# Patient Record
Sex: Female | Born: 1965
Health system: Southern US, Community
[De-identification: ages and names within clinical notes are randomized; demographics above are authoritative.]

## PROBLEM LIST (undated history)

## (undated) DIAGNOSIS — E559 Vitamin D deficiency, unspecified: Secondary | ICD-10-CM

## (undated) DIAGNOSIS — B029 Zoster without complications: Secondary | ICD-10-CM

## (undated) DIAGNOSIS — E119 Type 2 diabetes mellitus without complications: Secondary | ICD-10-CM

## (undated) HISTORY — DX: Vitamin D deficiency, unspecified: E55.9

## (undated) HISTORY — DX: Zoster without complications: B02.9

## (undated) HISTORY — DX: Type 2 diabetes mellitus without complications: E11.9

---

## 1998-01-07 ENCOUNTER — Other Ambulatory Visit: Admission: RE | Admit: 1998-01-07 | Discharge: 1998-01-07 | Payer: Self-pay | Admitting: Obstetrics and Gynecology

## 2001-03-08 ENCOUNTER — Other Ambulatory Visit: Admission: RE | Admit: 2001-03-08 | Discharge: 2001-03-08 | Payer: Self-pay | Admitting: Obstetrics and Gynecology

## 2002-12-10 ENCOUNTER — Encounter: Admission: RE | Admit: 2002-12-10 | Discharge: 2003-03-10 | Payer: Self-pay | Admitting: Family Medicine

## 2003-07-03 ENCOUNTER — Encounter: Admission: RE | Admit: 2003-07-03 | Discharge: 2003-10-01 | Payer: Self-pay | Admitting: Family Medicine

## 2008-07-23 ENCOUNTER — Encounter: Admission: RE | Admit: 2008-07-23 | Discharge: 2008-07-23 | Payer: Self-pay | Admitting: Obstetrics and Gynecology

## 2009-09-28 ENCOUNTER — Encounter: Admission: RE | Admit: 2009-09-28 | Discharge: 2009-09-28 | Payer: Self-pay | Admitting: Obstetrics and Gynecology

## 2009-10-29 ENCOUNTER — Encounter: Admission: RE | Admit: 2009-10-29 | Discharge: 2009-10-29 | Payer: Self-pay | Admitting: Obstetrics and Gynecology

## 2010-07-18 ENCOUNTER — Encounter: Payer: Self-pay | Admitting: Obstetrics and Gynecology

## 2010-11-02 ENCOUNTER — Other Ambulatory Visit: Payer: Self-pay | Admitting: Obstetrics and Gynecology

## 2013-07-11 ENCOUNTER — Other Ambulatory Visit: Payer: Self-pay | Admitting: Obstetrics and Gynecology

## 2016-07-11 DIAGNOSIS — Z3042 Encounter for surveillance of injectable contraceptive: Secondary | ICD-10-CM | POA: Diagnosis not present

## 2016-09-02 DIAGNOSIS — E785 Hyperlipidemia, unspecified: Secondary | ICD-10-CM | POA: Diagnosis not present

## 2016-09-02 DIAGNOSIS — E1165 Type 2 diabetes mellitus with hyperglycemia: Secondary | ICD-10-CM | POA: Diagnosis not present

## 2016-09-02 DIAGNOSIS — A6000 Herpesviral infection of urogenital system, unspecified: Secondary | ICD-10-CM | POA: Diagnosis not present

## 2016-09-07 DIAGNOSIS — Z01419 Encounter for gynecological examination (general) (routine) without abnormal findings: Secondary | ICD-10-CM | POA: Diagnosis not present

## 2016-09-07 DIAGNOSIS — Z1231 Encounter for screening mammogram for malignant neoplasm of breast: Secondary | ICD-10-CM | POA: Diagnosis not present

## 2016-09-07 DIAGNOSIS — Z681 Body mass index (BMI) 19 or less, adult: Secondary | ICD-10-CM | POA: Diagnosis not present

## 2016-10-03 DIAGNOSIS — Z3042 Encounter for surveillance of injectable contraceptive: Secondary | ICD-10-CM | POA: Diagnosis not present

## 2016-10-03 DIAGNOSIS — Z681 Body mass index (BMI) 19 or less, adult: Secondary | ICD-10-CM | POA: Diagnosis not present

## 2016-11-02 DIAGNOSIS — H25013 Cortical age-related cataract, bilateral: Secondary | ICD-10-CM | POA: Diagnosis not present

## 2016-11-02 DIAGNOSIS — E119 Type 2 diabetes mellitus without complications: Secondary | ICD-10-CM | POA: Diagnosis not present

## 2016-11-02 DIAGNOSIS — H26051 Posterior subcapsular polar infantile and juvenile cataract, right eye: Secondary | ICD-10-CM | POA: Diagnosis not present

## 2016-12-07 DIAGNOSIS — E1165 Type 2 diabetes mellitus with hyperglycemia: Secondary | ICD-10-CM | POA: Diagnosis not present

## 2017-01-02 DIAGNOSIS — Z3042 Encounter for surveillance of injectable contraceptive: Secondary | ICD-10-CM | POA: Diagnosis not present

## 2017-01-02 DIAGNOSIS — Z681 Body mass index (BMI) 19 or less, adult: Secondary | ICD-10-CM | POA: Diagnosis not present

## 2017-01-10 DIAGNOSIS — H2511 Age-related nuclear cataract, right eye: Secondary | ICD-10-CM | POA: Diagnosis not present

## 2017-01-10 DIAGNOSIS — H25043 Posterior subcapsular polar age-related cataract, bilateral: Secondary | ICD-10-CM | POA: Diagnosis not present

## 2017-01-10 DIAGNOSIS — H2513 Age-related nuclear cataract, bilateral: Secondary | ICD-10-CM | POA: Diagnosis not present

## 2017-01-10 DIAGNOSIS — H25013 Cortical age-related cataract, bilateral: Secondary | ICD-10-CM | POA: Diagnosis not present

## 2017-01-10 DIAGNOSIS — E119 Type 2 diabetes mellitus without complications: Secondary | ICD-10-CM | POA: Diagnosis not present

## 2017-03-01 DIAGNOSIS — E1165 Type 2 diabetes mellitus with hyperglycemia: Secondary | ICD-10-CM | POA: Diagnosis not present

## 2017-03-01 DIAGNOSIS — E785 Hyperlipidemia, unspecified: Secondary | ICD-10-CM | POA: Diagnosis not present

## 2017-03-13 DIAGNOSIS — H25013 Cortical age-related cataract, bilateral: Secondary | ICD-10-CM | POA: Diagnosis not present

## 2017-03-13 DIAGNOSIS — H52202 Unspecified astigmatism, left eye: Secondary | ICD-10-CM | POA: Diagnosis not present

## 2017-03-13 DIAGNOSIS — H2512 Age-related nuclear cataract, left eye: Secondary | ICD-10-CM | POA: Diagnosis not present

## 2017-03-14 DIAGNOSIS — H2511 Age-related nuclear cataract, right eye: Secondary | ICD-10-CM | POA: Diagnosis not present

## 2017-03-20 DIAGNOSIS — Z681 Body mass index (BMI) 19 or less, adult: Secondary | ICD-10-CM | POA: Diagnosis not present

## 2017-03-20 DIAGNOSIS — Z3046 Encounter for surveillance of implantable subdermal contraceptive: Secondary | ICD-10-CM | POA: Diagnosis not present

## 2017-04-03 DIAGNOSIS — H2511 Age-related nuclear cataract, right eye: Secondary | ICD-10-CM | POA: Diagnosis not present

## 2017-04-03 DIAGNOSIS — H26051 Posterior subcapsular polar infantile and juvenile cataract, right eye: Secondary | ICD-10-CM | POA: Diagnosis not present

## 2017-04-03 DIAGNOSIS — H52221 Regular astigmatism, right eye: Secondary | ICD-10-CM | POA: Diagnosis not present

## 2017-06-13 DIAGNOSIS — Z681 Body mass index (BMI) 19 or less, adult: Secondary | ICD-10-CM | POA: Diagnosis not present

## 2017-06-13 DIAGNOSIS — Z3042 Encounter for surveillance of injectable contraceptive: Secondary | ICD-10-CM | POA: Diagnosis not present

## 2017-06-14 DIAGNOSIS — E1165 Type 2 diabetes mellitus with hyperglycemia: Secondary | ICD-10-CM | POA: Diagnosis not present

## 2017-09-13 DIAGNOSIS — E785 Hyperlipidemia, unspecified: Secondary | ICD-10-CM | POA: Diagnosis not present

## 2017-09-13 DIAGNOSIS — E1165 Type 2 diabetes mellitus with hyperglycemia: Secondary | ICD-10-CM | POA: Diagnosis not present

## 2017-09-13 DIAGNOSIS — A6000 Herpesviral infection of urogenital system, unspecified: Secondary | ICD-10-CM | POA: Diagnosis not present

## 2017-09-26 DIAGNOSIS — E119 Type 2 diabetes mellitus without complications: Secondary | ICD-10-CM | POA: Diagnosis not present

## 2017-09-26 DIAGNOSIS — Z961 Presence of intraocular lens: Secondary | ICD-10-CM | POA: Diagnosis not present

## 2017-09-26 DIAGNOSIS — H26492 Other secondary cataract, left eye: Secondary | ICD-10-CM | POA: Diagnosis not present

## 2017-09-26 DIAGNOSIS — H18413 Arcus senilis, bilateral: Secondary | ICD-10-CM | POA: Diagnosis not present

## 2017-09-27 DIAGNOSIS — Z124 Encounter for screening for malignant neoplasm of cervix: Secondary | ICD-10-CM | POA: Diagnosis not present

## 2017-09-27 DIAGNOSIS — Z01419 Encounter for gynecological examination (general) (routine) without abnormal findings: Secondary | ICD-10-CM | POA: Diagnosis not present

## 2017-09-27 DIAGNOSIS — Z1231 Encounter for screening mammogram for malignant neoplasm of breast: Secondary | ICD-10-CM | POA: Diagnosis not present

## 2017-09-27 DIAGNOSIS — Z6820 Body mass index (BMI) 20.0-20.9, adult: Secondary | ICD-10-CM | POA: Diagnosis not present

## 2018-03-23 DIAGNOSIS — E785 Hyperlipidemia, unspecified: Secondary | ICD-10-CM | POA: Diagnosis not present

## 2018-03-23 DIAGNOSIS — E119 Type 2 diabetes mellitus without complications: Secondary | ICD-10-CM | POA: Diagnosis not present

## 2018-06-06 DIAGNOSIS — F419 Anxiety disorder, unspecified: Secondary | ICD-10-CM | POA: Diagnosis not present

## 2018-07-10 DIAGNOSIS — F419 Anxiety disorder, unspecified: Secondary | ICD-10-CM | POA: Diagnosis not present

## 2018-07-24 DIAGNOSIS — F419 Anxiety disorder, unspecified: Secondary | ICD-10-CM | POA: Diagnosis not present

## 2018-08-07 DIAGNOSIS — F419 Anxiety disorder, unspecified: Secondary | ICD-10-CM | POA: Diagnosis not present

## 2018-08-21 DIAGNOSIS — F419 Anxiety disorder, unspecified: Secondary | ICD-10-CM | POA: Diagnosis not present

## 2018-09-04 DIAGNOSIS — F419 Anxiety disorder, unspecified: Secondary | ICD-10-CM | POA: Diagnosis not present

## 2018-09-21 DIAGNOSIS — E785 Hyperlipidemia, unspecified: Secondary | ICD-10-CM | POA: Diagnosis not present

## 2018-09-21 DIAGNOSIS — E119 Type 2 diabetes mellitus without complications: Secondary | ICD-10-CM | POA: Diagnosis not present

## 2018-09-21 DIAGNOSIS — H6123 Impacted cerumen, bilateral: Secondary | ICD-10-CM | POA: Diagnosis not present

## 2018-10-02 DIAGNOSIS — F419 Anxiety disorder, unspecified: Secondary | ICD-10-CM | POA: Diagnosis not present

## 2018-10-16 DIAGNOSIS — F419 Anxiety disorder, unspecified: Secondary | ICD-10-CM | POA: Diagnosis not present

## 2018-10-30 DIAGNOSIS — F419 Anxiety disorder, unspecified: Secondary | ICD-10-CM | POA: Diagnosis not present

## 2018-11-15 DIAGNOSIS — F419 Anxiety disorder, unspecified: Secondary | ICD-10-CM | POA: Diagnosis not present

## 2018-11-29 DIAGNOSIS — Z6820 Body mass index (BMI) 20.0-20.9, adult: Secondary | ICD-10-CM | POA: Diagnosis not present

## 2018-11-29 DIAGNOSIS — Z01419 Encounter for gynecological examination (general) (routine) without abnormal findings: Secondary | ICD-10-CM | POA: Diagnosis not present

## 2018-11-29 DIAGNOSIS — Z1231 Encounter for screening mammogram for malignant neoplasm of breast: Secondary | ICD-10-CM | POA: Diagnosis not present

## 2018-12-06 ENCOUNTER — Other Ambulatory Visit: Payer: Self-pay | Admitting: Obstetrics and Gynecology

## 2018-12-06 DIAGNOSIS — F419 Anxiety disorder, unspecified: Secondary | ICD-10-CM | POA: Diagnosis not present

## 2018-12-06 DIAGNOSIS — N6310 Unspecified lump in the right breast, unspecified quadrant: Secondary | ICD-10-CM

## 2018-12-21 ENCOUNTER — Other Ambulatory Visit: Payer: Self-pay

## 2018-12-25 ENCOUNTER — Ambulatory Visit
Admission: RE | Admit: 2018-12-25 | Discharge: 2018-12-25 | Disposition: A | Discharge status: Home / Self Care | Payer: Self-pay | Source: Ambulatory Visit | Attending: Obstetrics and Gynecology | Admitting: Obstetrics and Gynecology

## 2018-12-25 ENCOUNTER — Other Ambulatory Visit: Payer: Self-pay

## 2018-12-25 DIAGNOSIS — N6310 Unspecified lump in the right breast, unspecified quadrant: Secondary | ICD-10-CM

## 2018-12-27 DIAGNOSIS — F419 Anxiety disorder, unspecified: Secondary | ICD-10-CM | POA: Diagnosis not present

## 2019-01-09 DIAGNOSIS — F419 Anxiety disorder, unspecified: Secondary | ICD-10-CM | POA: Diagnosis not present

## 2019-01-17 DIAGNOSIS — F419 Anxiety disorder, unspecified: Secondary | ICD-10-CM | POA: Diagnosis not present

## 2019-01-24 DIAGNOSIS — F419 Anxiety disorder, unspecified: Secondary | ICD-10-CM | POA: Diagnosis not present

## 2019-02-05 DIAGNOSIS — F419 Anxiety disorder, unspecified: Secondary | ICD-10-CM | POA: Diagnosis not present

## 2019-02-27 DIAGNOSIS — F419 Anxiety disorder, unspecified: Secondary | ICD-10-CM | POA: Diagnosis not present

## 2019-03-20 DIAGNOSIS — F419 Anxiety disorder, unspecified: Secondary | ICD-10-CM | POA: Diagnosis not present

## 2019-04-03 DIAGNOSIS — F419 Anxiety disorder, unspecified: Secondary | ICD-10-CM | POA: Diagnosis not present

## 2019-04-17 DIAGNOSIS — F419 Anxiety disorder, unspecified: Secondary | ICD-10-CM | POA: Diagnosis not present

## 2019-05-02 DIAGNOSIS — F419 Anxiety disorder, unspecified: Secondary | ICD-10-CM | POA: Diagnosis not present

## 2019-05-20 DIAGNOSIS — F419 Anxiety disorder, unspecified: Secondary | ICD-10-CM | POA: Diagnosis not present

## 2019-06-03 DIAGNOSIS — F419 Anxiety disorder, unspecified: Secondary | ICD-10-CM | POA: Diagnosis not present

## 2019-07-11 DIAGNOSIS — F419 Anxiety disorder, unspecified: Secondary | ICD-10-CM | POA: Diagnosis not present

## 2019-07-19 DIAGNOSIS — L659 Nonscarring hair loss, unspecified: Secondary | ICD-10-CM | POA: Diagnosis not present

## 2019-07-19 DIAGNOSIS — E1165 Type 2 diabetes mellitus with hyperglycemia: Secondary | ICD-10-CM | POA: Diagnosis not present

## 2019-07-19 DIAGNOSIS — E559 Vitamin D deficiency, unspecified: Secondary | ICD-10-CM | POA: Diagnosis not present

## 2019-07-19 DIAGNOSIS — E785 Hyperlipidemia, unspecified: Secondary | ICD-10-CM | POA: Diagnosis not present

## 2019-07-24 IMAGING — US ULTRASOUND RIGHT BREAST LIMITED
1 series · 2 of 2 positions shown · non-contrast
Comparison: Previous exams including recent bilateral screening
mammogram dated 11/29/2018.

CLINICAL DATA: Ordering physician describes a palpable lump within
the upper RIGHT breast.

EXAM:
DIGITAL DIAGNOSTIC RIGHT MAMMOGRAM WITH CAD AND TOMO
ULTRASOUND RIGHT BREAST

[Series 1: ultrasound right breast limited · 0.06mm/px · 2 of 2 slices shown]
[im 1/2]
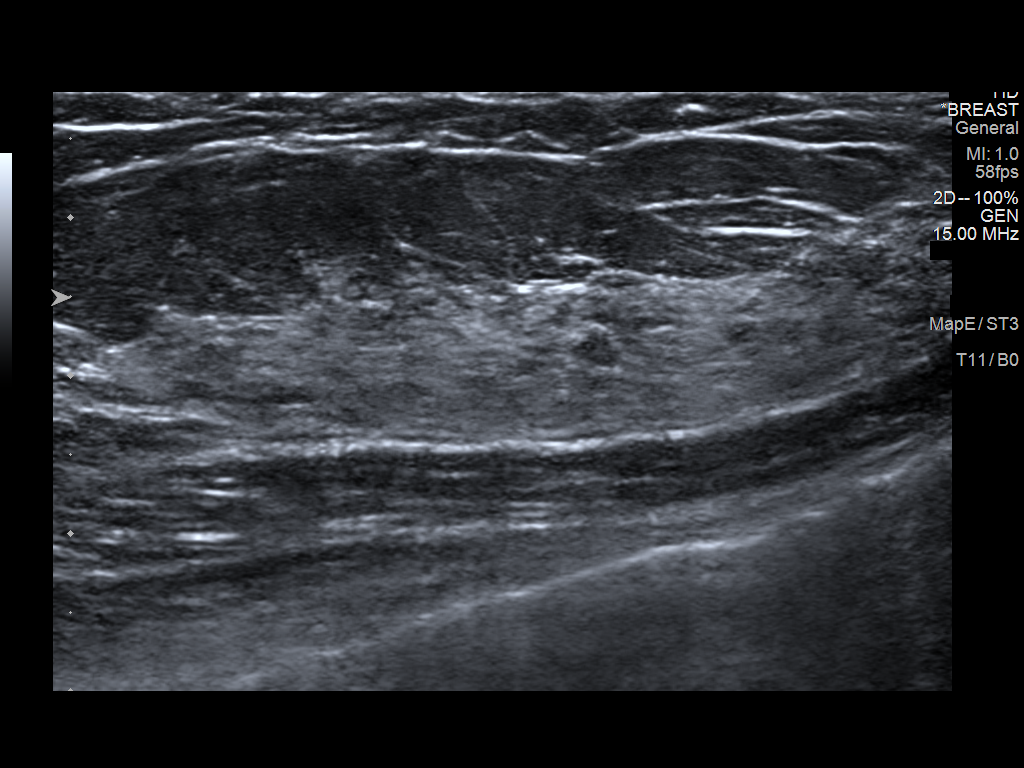
[im 2/2]
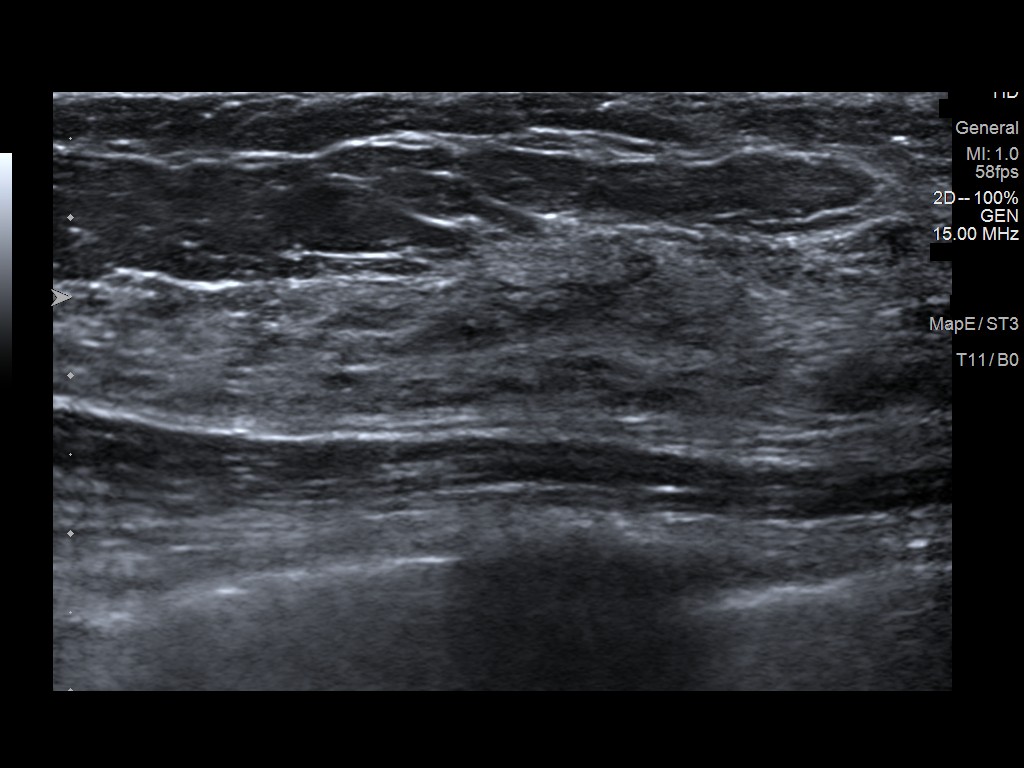

[2 of 2 positions shown; findings below may reference images not displayed]

ACR Breast Density Category c: The breast tissue is heterogeneously
dense, which may obscure small masses.
FINDINGS: On today's additional tangential view with spot compression and 3D
tomosynthesis, there is no new dominant mass, suspicious
calcifications or secondary signs of malignancy identified in the
area of concern in the upper RIGHT breast.

Mammographic images were processed with CAD.

Targeted ultrasound is performed, showing a ridge of normal dense
fibroglandular tissue in the RIGHT breast at the 12:30 o'clock axis,
4 cm from the nipple, corresponding to the area of clinical concern.
No solid or cystic mass.
IMPRESSION: No evidence of malignancy within the RIGHT breast. Ridge of normal
dense fibroglandular tissue within the upper RIGHT breast,
corresponding to the area of clinical concern.

RECOMMENDATION:
1. Screening mammogram in one year.(Code:N2-8-WI4).
2. The patient was instructed to return sooner if the area that she
feels becomes larger and/or firmer to palpation, or if a new
palpable abnormality is identified in either breast.

I have discussed the findings and recommendations with the patient.
Results were also provided in writing at the conclusion of the
visit. If applicable, a reminder letter will be sent to the patient
regarding the next appointment.

BI-RADS CATEGORY  1: Negative.

## 2019-07-24 IMAGING — MG DIGITAL DIAGNOSTIC UNILATERAL RIGHT MAMMOGRAM WITH TOMO AND CAD
2 series · 3 of 6 positions shown · non-contrast
Comparison: Previous exams including recent bilateral screening
mammogram dated 11/29/2018.

CLINICAL DATA: Ordering physician describes a palpable lump within
the upper RIGHT breast.

EXAM:
DIGITAL DIAGNOSTIC RIGHT MAMMOGRAM WITH CAD AND TOMO
ULTRASOUND RIGHT BREAST

[R TAN synth-2D]
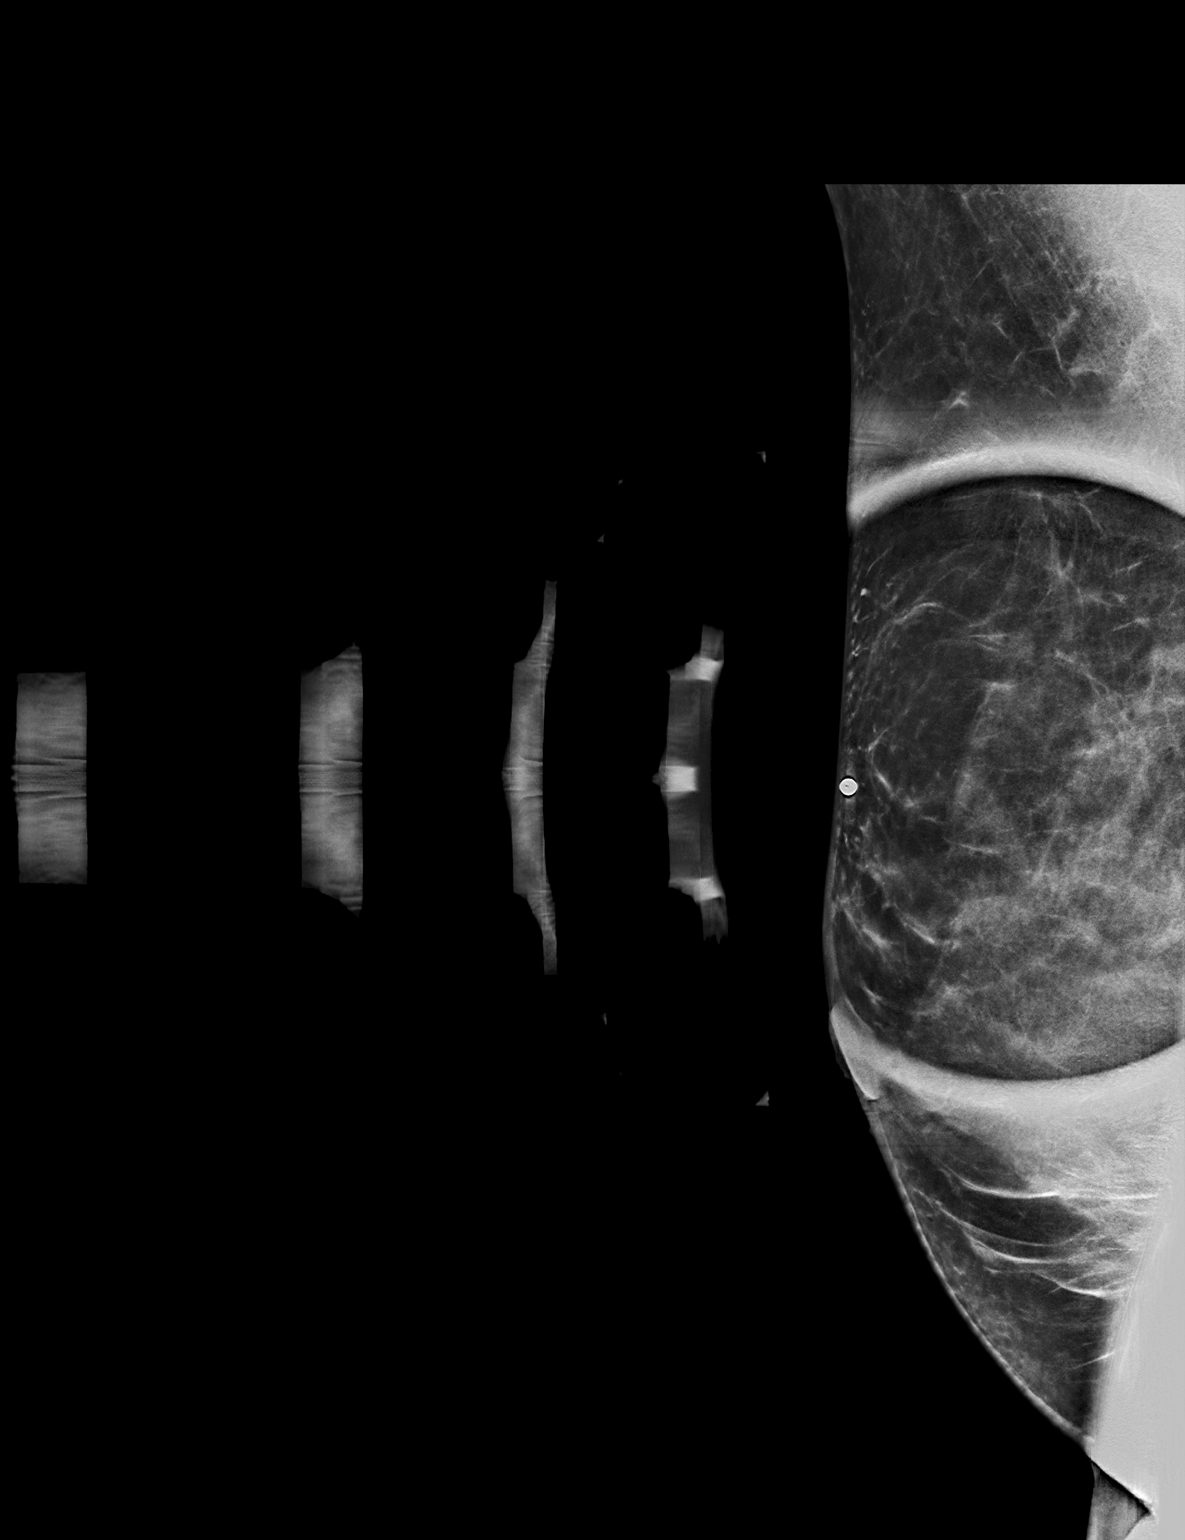

[R TAN tomo · 2 of 61 frames shown]
[frame 20/61]
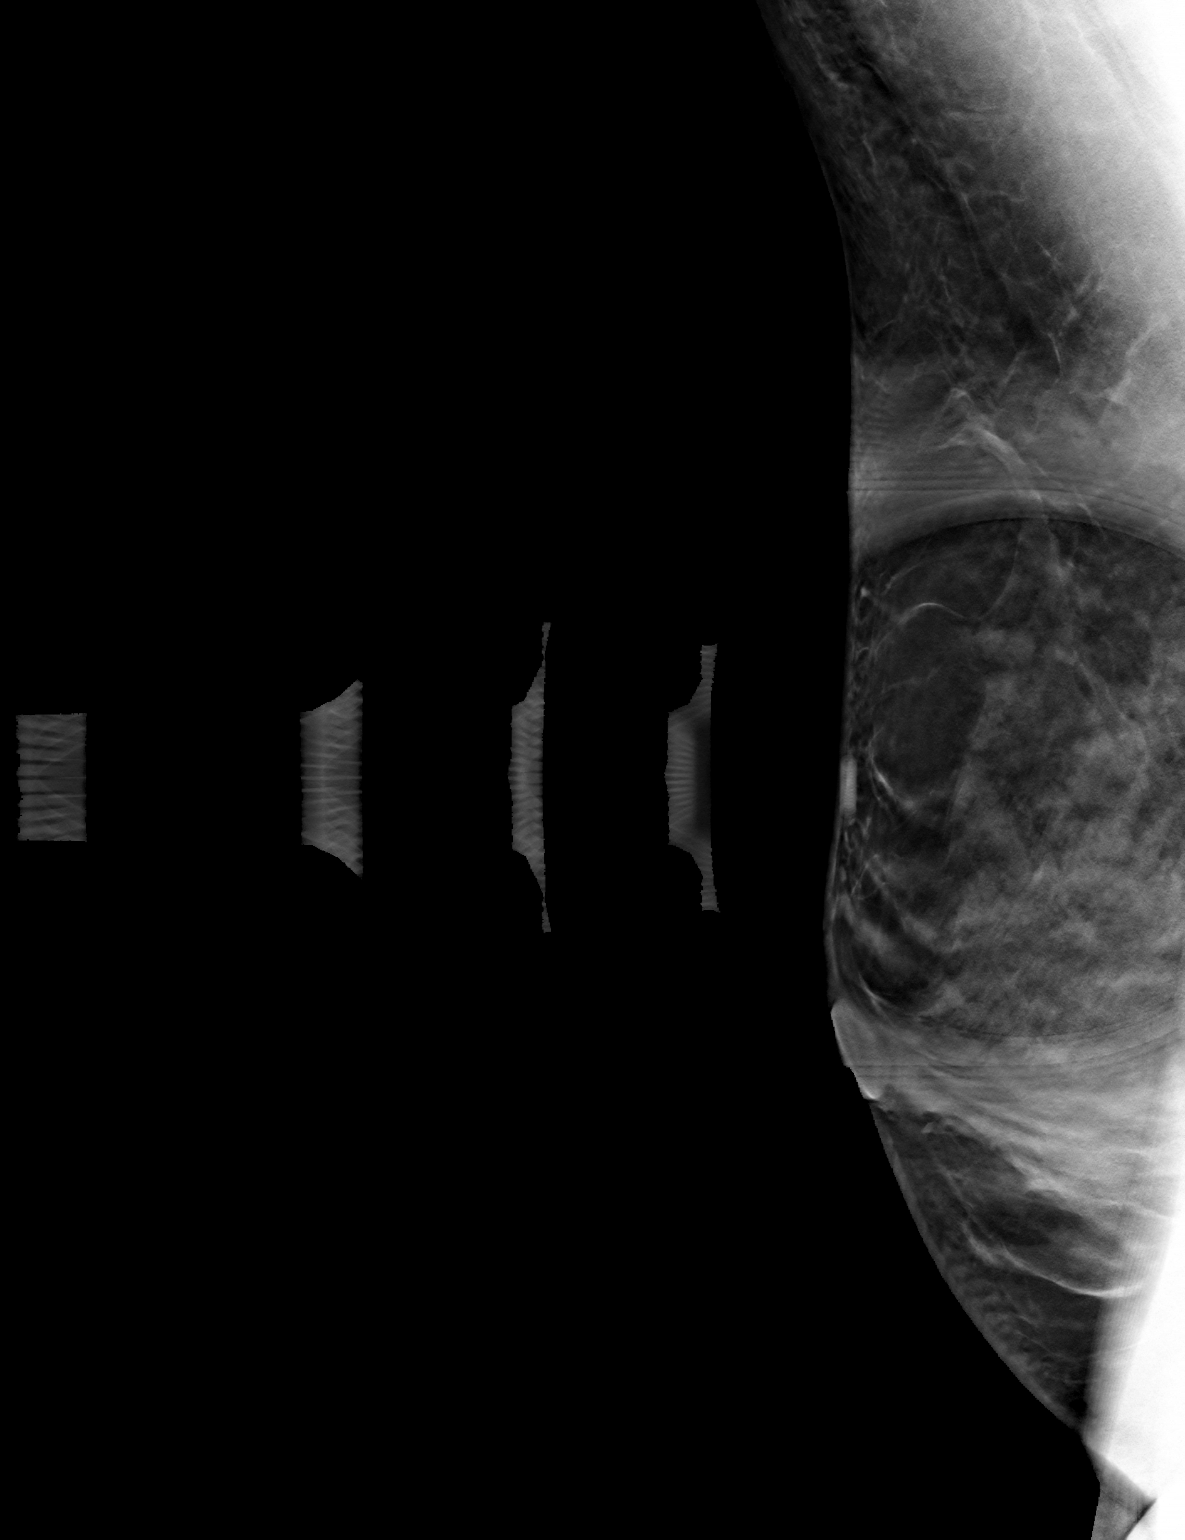
[frame 31/61]
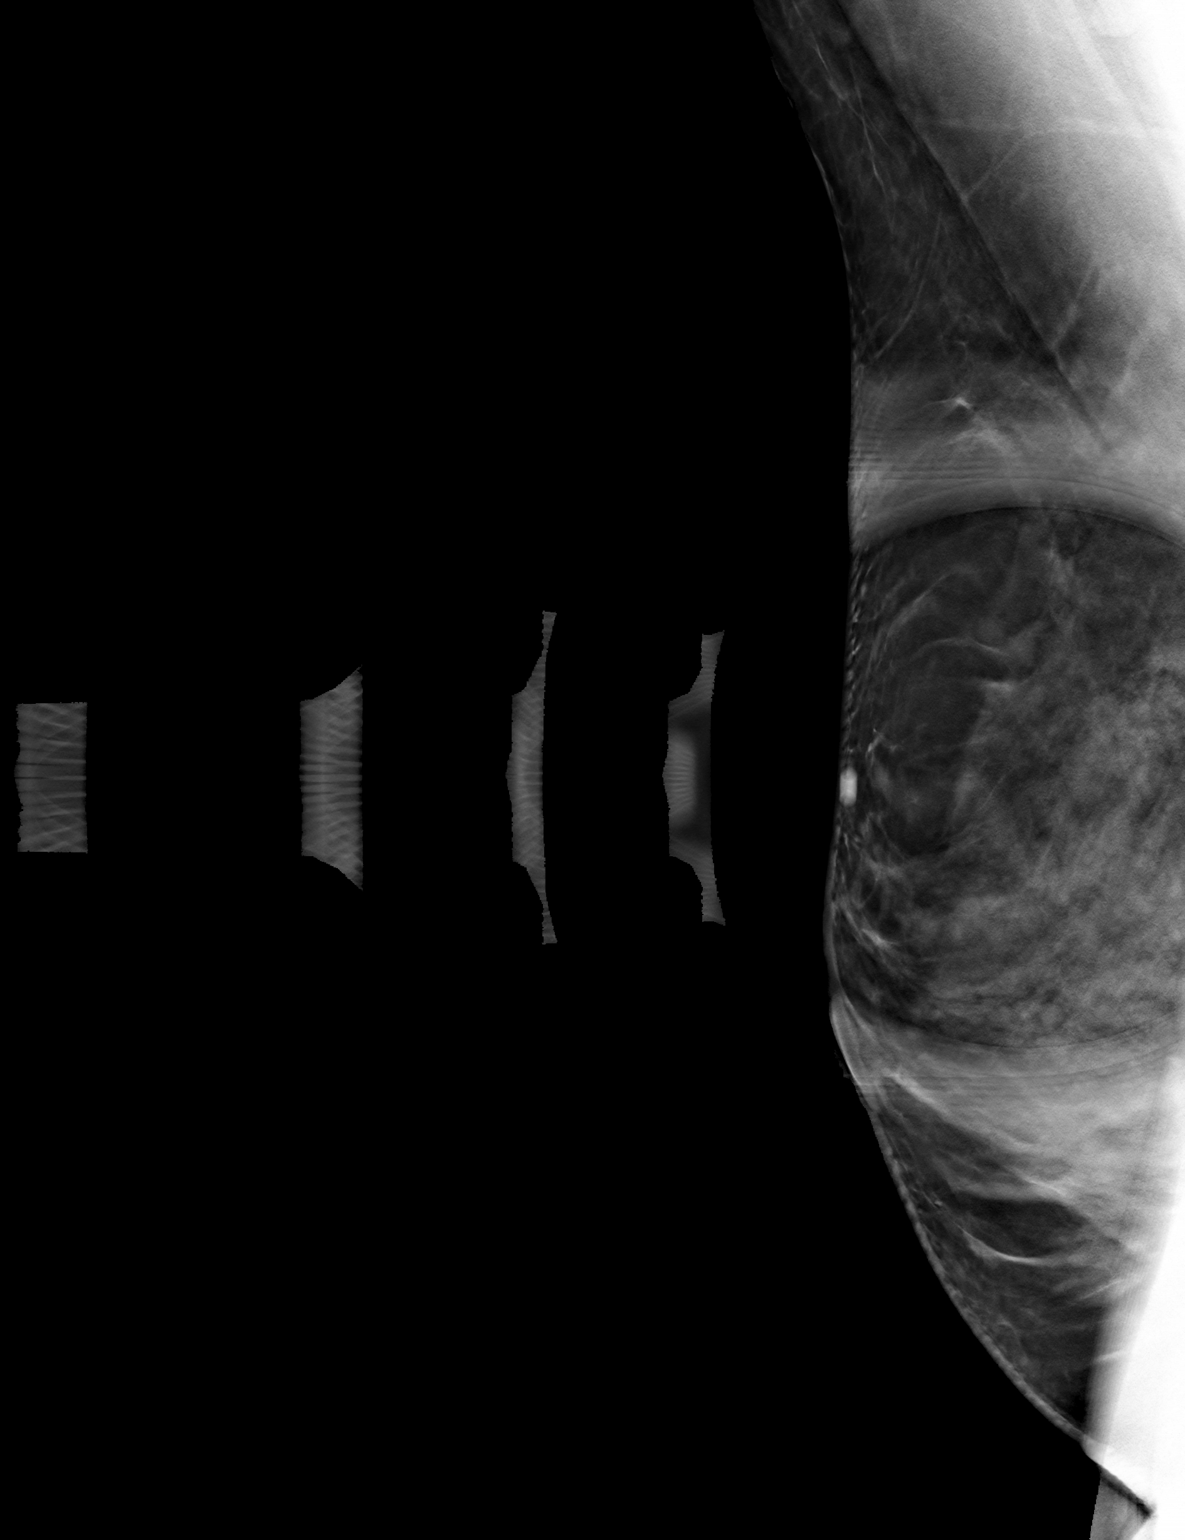

[3 of 6 positions shown; findings below may reference images not displayed]

ACR Breast Density Category c: The breast tissue is heterogeneously
dense, which may obscure small masses.
FINDINGS: On today's additional tangential view with spot compression and 3D
tomosynthesis, there is no new dominant mass, suspicious
calcifications or secondary signs of malignancy identified in the
area of concern in the upper RIGHT breast.

Mammographic images were processed with CAD.

Targeted ultrasound is performed, showing a ridge of normal dense
fibroglandular tissue in the RIGHT breast at the 12:30 o'clock axis,
4 cm from the nipple, corresponding to the area of clinical concern.
No solid or cystic mass.
IMPRESSION: No evidence of malignancy within the RIGHT breast. Ridge of normal
dense fibroglandular tissue within the upper RIGHT breast,
corresponding to the area of clinical concern.

RECOMMENDATION:
1. Screening mammogram in one year.(Code:N2-8-WI4).
2. The patient was instructed to return sooner if the area that she
feels becomes larger and/or firmer to palpation, or if a new
palpable abnormality is identified in either breast.

I have discussed the findings and recommendations with the patient.
Results were also provided in writing at the conclusion of the
visit. If applicable, a reminder letter will be sent to the patient
regarding the next appointment.

BI-RADS CATEGORY  1: Negative.

## 2019-08-01 DIAGNOSIS — F419 Anxiety disorder, unspecified: Secondary | ICD-10-CM | POA: Diagnosis not present

## 2019-08-08 DIAGNOSIS — E119 Type 2 diabetes mellitus without complications: Secondary | ICD-10-CM | POA: Diagnosis not present

## 2019-09-09 DIAGNOSIS — F419 Anxiety disorder, unspecified: Secondary | ICD-10-CM | POA: Diagnosis not present

## 2019-09-30 DIAGNOSIS — F419 Anxiety disorder, unspecified: Secondary | ICD-10-CM | POA: Diagnosis not present

## 2019-11-01 ENCOUNTER — Encounter: Payer: Self-pay | Admitting: Internal Medicine

## 2019-11-01 ENCOUNTER — Ambulatory Visit: Payer: BC Managed Care – PPO | Admitting: Internal Medicine

## 2019-11-01 ENCOUNTER — Other Ambulatory Visit: Payer: Self-pay

## 2019-11-01 VITALS — BP 128/88 | HR 98 | Temp 98.1°F | Ht 66.0 in | Wt 130.6 lb

## 2019-11-01 DIAGNOSIS — E785 Hyperlipidemia, unspecified: Secondary | ICD-10-CM

## 2019-11-01 DIAGNOSIS — E1165 Type 2 diabetes mellitus with hyperglycemia: Secondary | ICD-10-CM | POA: Diagnosis not present

## 2019-11-01 DIAGNOSIS — E559 Vitamin D deficiency, unspecified: Secondary | ICD-10-CM | POA: Insufficient documentation

## 2019-11-01 DIAGNOSIS — R739 Hyperglycemia, unspecified: Secondary | ICD-10-CM

## 2019-11-01 LAB — POCT GLYCOSYLATED HEMOGLOBIN (HGB A1C): Hemoglobin A1C: 7.7 % — AB (ref 4.0–5.6)

## 2019-11-01 LAB — VITAMIN D 25 HYDROXY (VIT D DEFICIENCY, FRACTURES): VITD: 93.51 ng/mL (ref 30.00–100.00)

## 2019-11-01 NOTE — Progress Notes (Signed)
Name: Rachel Dillon  MRN/ DOB: [id number], Jun 10, 1966   Age/ Sex: 54 y.o., female    PCP: Deatra James, MD   Reason for Endocrinology Evaluation: Type 2 Diabetes Mellitus     Date of Initial Endocrinology Visit: 11/01/2019     PATIENT IDENTIFIER: [name] is a 54 y.o. female with a past medical history of T2DM, and Dyslipidemia. The patient presented for initial endocrinology clinic visit on 11/01/2019 for consultative assistance with her diabetes management.    HPI: [name] was    Diagnosed with DM 2010 Prior Medications tried/Intolerance: as listed  Currently checking blood sugars 2 x / day.  Hypoglycemia episodes :No              Hemoglobin A1c has ranged from 6.1% in 2017, peaking at 8.7% in 2021. Patient required assistance for hypoglycemia: no  Patient has required hospitalization within the last 1 year from hyper or hypoglycemia: no   In terms of diet, the patient eats 3 meals days, snacks in the evenings. Avoids sugar-sweetened beverages.    HOME DIABETES REGIMEN: Metformin 500 mg BID  Glimepiride 2 mg  BID ( lunch and supper) Januvia 100 mg daily    Statin: Yes ACE-I/ARB: no Prior Diabetic Education: yes   METER DOWNLOAD SUMMARY: [place]  82-250 mg/dl    DIABETIC COMPLICATIONS: Microvascular complications:    Denies: retinopathy, neuropathy, CKD   Last eye exam: Completed 07/2019  Macrovascular complications:    Denies: CAD, PVD, CVA   PAST HISTORY:  Past Medical History: No past medical history on file.  Past Surgical History:    Social History:  reports that she has never smoked. She has never used smokeless tobacco. She reports that she does not drink alcohol. No history on file for drug.  Family History: No family history on file.   HOME MEDICATIONS: Allergies as of 11/01/2019   No Known Allergies     Medication List       Accurate as of Nov 01, 2019  7:54 AM. If you have any questions, ask your nurse or doctor.          COD LIVER OIL PO Take by mouth. daily   glimepiride 2 MG tablet Commonly known as: AMARYL glimepiride 2 mg tablet   Januvia 100 MG tablet Generic drug: sitaGLIPtin Take 100 mg by mouth daily.   metFORMIN 500 MG tablet Commonly known as: GLUCOPHAGE Take 1,000 mg by mouth 2 (two) times daily.   norethindrone-ethinyl estradiol 1-5 MG-MCG Tabs tablet Commonly known as: FEMHRT 1/5 norethindrone acetate 1 mg-ethinyl estradiol 5 mcg tablet  TK 1 T PO QD   simvastatin 20 MG tablet Commonly known as: ZOCOR simvastatin 20 mg tablet   UNABLE TO FIND Med Name: livago meter   valACYclovir 500 MG tablet Commonly known as: VALTREX valacyclovir 500 mg tablet  Take 1 tablet every day by oral route.   Vitamin D (Ergocalciferol) 1.25 MG (50000 UNIT) Caps capsule Commonly known as: DRISDOL Take 50,000 Units by mouth 2 (two) times a week.        ALLERGIES: No Known Allergies   REVIEW OF SYSTEMS: A comprehensive ROS was conducted with the patient and is negative except as per HPI    OBJECTIVE:   VITAL SIGNS: BP 128/88 (BP Location: Left Arm, Patient Position: Sitting, Cuff Size: Normal)   Pulse 98   Temp 98.1 F (36.7 C)   Ht 5\' 6"  (1.676 m)   Wt 130 lb 9.6 oz (59.2 kg)  SpO2 98%   BMI 21.08 kg/m    PHYSICAL EXAM:  General: Pt appears well and is in NAD  HEENT:  Eyes: External eye exam normal without stare, lid lag or exophthalmos.  EOM intact.    Neck: General: Supple without adenopathy or carotid bruits. Thyroid: Thyroid size normal.  No goiter or nodules appreciated. No thyroid bruit.  Lungs: Clear with good BS bilat with no rales, rhonchi, or wheezes  Heart: RRR with normal S1 and S2 and no gallops; no murmurs; no rub  Abdomen: Normoactive bowel sounds, soft, nontender, without masses or organomegaly palpable  Extremities:  Lower extremities - No pretibial edema. No lesions.  Skin: Normal texture and temperature to palpation.   Neuro: MS is good with  appropriate affect, pt is alert and Ox3    DM foot exam: deferred   DATA REVIEWED: 07/19/2019 TG 60 HDL 60 LDL 120  A1c 8.7% TSH 1.81  BUN/Cr 16/0.81  GFR 89 MA/Cr ratio 7.7    Vitamin D 13  ASSESSMENT / PLAN / RECOMMENDATIONS:   1) Type 2 Diabetes Mellitus, Sub-optimally controlled, Without complications - Most recent A1c of 7.7 %. Goal A1c < 7.0 %.   Plan: GENERAL:  Improved glycemic control due to better compliance with glycemic agents and making lifestyle changes with exercise  I have discussed with the patient the pathophysiology of diabetes. We went over the natural progression of the disease. We talked about both insulin resistance and insulin deficiency. We stressed the importance of lifestyle changes including diet and exercise. I explained the complications associated with diabetes including retinopathy, nephropathy, neuropathy as well as increased risk of cardiovascular disease. We went over the benefit seen with glycemic control.    I explained to the patient that diabetic patients are at higher than normal risk for amputations.   I have encouraged her to continue to avoid sugar-sweetened beverages and avoid snacks, we discussed options for low carb snack if necessary  We also discussed pharmacokinetics of meds and the importance of taking them correctly . She has fear of hypoglycemia  We initially discussed switching Januvia to Rybelsus but  Her BMI is on the low normal and this will make her lose more weight and she is not interested in this at this time.  Will check for evidence of autoimmune DM   MEDICATIONS:  Metformin 500 mg BID   Glimepiride 2 mg, half a tablet before breakfast and 1 tablet before supper  Continue Januvia 100 mg daily   EDUCATION / INSTRUCTIONS:  BG monitoring instructions: Patient is instructed to check her blood sugars 2 times a day, before breakfast and supper .  Call [facility] [facility] if: BG persistently < 70 . I  reviewed the Rule of 15 for the treatment of hypoglycemia in detail with the patient. Literature supplied.   2) Diabetic complications:   Eye: Does not have known diabetic retinopathy.   Neuro/ Feet: Does not have known diabetic peripheral neuropathy.  Renal: Patient does not have known baseline CKD. She is not on an ACEI/ARB at present. Up to date on urine albumin/creatinine ratio   3) Lipids: Patient is on simvastatin 20 mg daily . LDL above goal, this is due to previous imperfect adherence. Discussed cardiovascular benefits and encouraged compliance.    4) Vitamin D Deficiency:  - She completed ergocalciferol and would like to have Vitamin D level checked today  - Repeat testing is high-normal.  - I initially advised her to statr OTC Vitamin D3 but  after seeing her current level, she will be advised to hold off on this at this time.    F/U in 3 months     Signed electronically by: Lyndle Herrlich, MD  [facility] Endocrinology  [facility] [address] [place], [place] [address] Phone: [phone] FAX: [phone]   CC: Deatra James, MD [address] [place] [address] Phone: [phone]  Fax: [phone]    Return to Endocrinology clinic as below: No future appointments.

## 2019-11-01 NOTE — Patient Instructions (Signed)
-   Continue Metformin 500 mg , 1 tablet with breakfast and 1 tablet with supper - Glimepiride 2 mg, HALF a tablet before Breakfast and ONE tablet before Supper - Continue Januvia 100 mg daily     - Check sugar before breakfast and supper     HOW TO TREAT LOW BLOOD SUGARS (Blood sugar LESS THAN 70 MG/DL)  Please follow the RULE OF 15 for the treatment of hypoglycemia treatment (when your (blood sugars are less than 70 mg/dL)    STEP 1: Take 15 grams of carbohydrates when your blood sugar is low, which includes:   3-4 GLUCOSE TABS  OR  3-4 OZ OF JUICE OR REGULAR SODA OR  ONE TUBE OF GLUCOSE GEL     STEP 2: RECHECK blood sugar in 15 MINUTES STEP 3: If your blood sugar is still low at the 15 minute recheck --> then, go back to STEP 1 and treat AGAIN with another 15 grams of carbohydrates.

## 2019-11-09 LAB — ISLET CELL AB SCREEN RFLX TO TITER: ISLET CELL ANTIBODY SCREEN: NEGATIVE

## 2019-11-09 LAB — GLUTAMIC ACID DECARBOXYLASE AUTO ABS: Glutamic Acid Decarb Ab: <5 IU/mL (ref <5)

## 2019-11-11 DIAGNOSIS — F419 Anxiety disorder, unspecified: Secondary | ICD-10-CM | POA: Diagnosis not present

## 2019-12-04 ENCOUNTER — Encounter: Payer: Self-pay | Admitting: Internal Medicine

## 2019-12-04 ENCOUNTER — Other Ambulatory Visit: Payer: Self-pay

## 2019-12-04 MED ORDER — SITAGLIPTIN PHOSPHATE 100 MG PO TABS: 100.0000 mg | ORAL_TABLET | Freq: Every day | ORAL | 1 refills | Status: DC

## 2019-12-05 DIAGNOSIS — F419 Anxiety disorder, unspecified: Secondary | ICD-10-CM | POA: Diagnosis not present

## 2019-12-19 ENCOUNTER — Other Ambulatory Visit: Payer: Self-pay | Admitting: Obstetrics and Gynecology

## 2019-12-19 DIAGNOSIS — Z1231 Encounter for screening mammogram for malignant neoplasm of breast: Secondary | ICD-10-CM | POA: Diagnosis not present

## 2019-12-19 DIAGNOSIS — Z6820 Body mass index (BMI) 20.0-20.9, adult: Secondary | ICD-10-CM | POA: Diagnosis not present

## 2019-12-19 DIAGNOSIS — R5381 Other malaise: Secondary | ICD-10-CM

## 2019-12-19 DIAGNOSIS — Z01419 Encounter for gynecological examination (general) (routine) without abnormal findings: Secondary | ICD-10-CM | POA: Diagnosis not present

## 2019-12-25 DIAGNOSIS — F419 Anxiety disorder, unspecified: Secondary | ICD-10-CM | POA: Diagnosis not present

## 2020-01-01 ENCOUNTER — Other Ambulatory Visit: Payer: Self-pay | Admitting: Obstetrics and Gynecology

## 2020-01-01 DIAGNOSIS — E2839 Other primary ovarian failure: Secondary | ICD-10-CM

## 2020-01-10 DIAGNOSIS — Z0000 Encounter for general adult medical examination without abnormal findings: Secondary | ICD-10-CM | POA: Diagnosis not present

## 2020-01-10 DIAGNOSIS — Z1331 Encounter for screening for depression: Secondary | ICD-10-CM | POA: Diagnosis not present

## 2020-01-20 DIAGNOSIS — F419 Anxiety disorder, unspecified: Secondary | ICD-10-CM | POA: Diagnosis not present

## 2020-01-28 ENCOUNTER — Other Ambulatory Visit: Payer: Self-pay

## 2020-01-28 ENCOUNTER — Encounter: Payer: Self-pay | Admitting: Internal Medicine

## 2020-01-28 MED ORDER — GLIMEPIRIDE 2 MG PO TABS: ORAL_TABLET | ORAL | 1 refills | Status: DC

## 2020-02-14 ENCOUNTER — Ambulatory Visit: Payer: BC Managed Care – PPO | Admitting: Internal Medicine

## 2020-02-14 ENCOUNTER — Other Ambulatory Visit: Payer: Self-pay

## 2020-02-14 ENCOUNTER — Encounter: Payer: Self-pay | Admitting: Internal Medicine

## 2020-02-14 VITALS — BP 120/84 | HR 91 | Resp 18 | Ht 66.0 in | Wt 129.6 lb

## 2020-02-14 DIAGNOSIS — E785 Hyperlipidemia, unspecified: Secondary | ICD-10-CM

## 2020-02-14 DIAGNOSIS — E1165 Type 2 diabetes mellitus with hyperglycemia: Secondary | ICD-10-CM

## 2020-02-14 LAB — POCT GLYCOSYLATED HEMOGLOBIN (HGB A1C): Hemoglobin A1C: 7.8 % — AB (ref 4.0–5.6)

## 2020-02-14 MED ORDER — METFORMIN HCL 500 MG/5ML PO SOLN: 10.0000 mL | Freq: Two times a day (BID) | ORAL | 11 refills | Status: DC

## 2020-02-14 MED ORDER — GLIMEPIRIDE 2 MG PO TABS: ORAL_TABLET | ORAL | 1 refills | Status: DC

## 2020-02-14 NOTE — Progress Notes (Signed)
Name: Rachel Dillon  Age/ Sex: 54 y.o., female   MRN/ DOB: [id number], 28-Jan-1966     PCP: Alysia Penna, MD   Reason for Endocrinology Evaluation: Type 2 Diabetes Mellitus  Initial Endocrine Consultative Visit: 11/01/2019    PATIENT IDENTIFIER: [name] is a 54 y.o. female with a past medical history of T2DM and dyslipidemia. The patient has followed with Endocrinology clinic since 11/01/2019 for consultative assistance with management of her diabetes.  DIABETIC HISTORY:  [name] was diagnosed with T2DM in 2010.Has been on oral glycemic agents since diagnosis.  Her hemoglobin A1c has ranged from 6.1% in 2017, peaking at 8.7% in 2021  On her initial visit to our clinic she had an A1c 7.7% . She was on metformin, Januvia and Glimepiride. We discussed switching Januvia to Rybelsus but pt did not want to lose any more weight. We increased Glimepiride, continued metformin and Januvia   GAD-65 and iselt cell Ab's levels are negative     SUBJECTIVE:   During the last visit (11/01/2019): A1c 7.7 % We increased Glimepiride, continued metformin and Januvia    Today (02/14/2020): Ms. Hemric  She checks her blood sugars 2 times daily, preprandial to breakfast and supper. The patient has not had hypoglycemic episodes since the last clinic visit. She has been very stressed the past few months, lost her job and started a new job, admits to dietary indiscretions and medication non-adherence due to stress.  She has settled in her new job for the past 2 weeks   Is asking for a liquid metformin, having to crush metformin tablets, due to dysphagia     HOME DIABETES REGIMEN:   Metformin 500 mg, 1 tablets  BID - pt taking 2 tabs BID   Glimepiride 2 mg, half a tablet before breakfast and 1 tablet before supper   Januvia 100 mg daily       Statin: yes ACE-I/ARB: no   GLUCOSE LOG :  Fasting > 200 Supper 114- 190    DIABETIC COMPLICATIONS: Microvascular complications:     Denies: retinopathy, neuropathy, CKD  Last Eye Exam: Completed 07/2019  Macrovascular complications:    Denies: CAD, CVA, PVD   HISTORY:   Past Medical History: History reviewed. No pertinent past medical history.  Past Surgical History: History reviewed. No pertinent surgical history.  Social History:  reports that she has never smoked. She has never used smokeless tobacco. She reports that she does not drink alcohol. No history on file for drug use.  Family History: History reviewed. No pertinent family history.   HOME MEDICATIONS: Allergies as of 02/14/2020   No Known Allergies     Medication List       Accurate as of February 14, 2020  8:41 AM. If you have any questions, ask your nurse or doctor.        COD LIVER OIL PO Take by mouth. daily   glimepiride 2 MG tablet Commonly known as: AMARYL HALF a tablet before Breakfast and ONE tablet before Supper   metFORMIN 500 MG tablet Commonly known as: GLUCOPHAGE Take 1,000 mg by mouth 2 (two) times daily.   norethindrone-ethinyl estradiol 1-5 MG-MCG Tabs tablet Commonly known as: FEMHRT 1/5 norethindrone acetate 1 mg-ethinyl estradiol 5 mcg tablet  TK 1 T PO QD   simvastatin 20 MG tablet Commonly known as: ZOCOR simvastatin 20 mg tablet   sitaGLIPtin 100 MG tablet Commonly known as: Januvia Take 1 tablet (100 mg total) by mouth daily. E11.65   UNABLE  TO FIND Med Name: livago meter   valACYclovir 500 MG tablet Commonly known as: VALTREX valacyclovir 500 mg tablet  Take 1 tablet every day by oral route.   Vitamin D (Ergocalciferol) 1.25 MG (50000 UNIT) Caps capsule Commonly known as: DRISDOL Take 50,000 Units by mouth 2 (two) times a week.        OBJECTIVE:   Vital Signs: BP 120/84   Pulse 91   Resp 18   Ht 5\' 6"  (1.676 m)   Wt 129 lb 9.6 oz (58.8 kg)   SpO2 98%   BMI 20.92 kg/m   Wt Readings from Last 3 Encounters:  02/14/20 129 lb 9.6 oz (58.8 kg)  11/01/19 130 lb 9.6 oz (59.2 kg)      Exam: General: Pt appears well and is in NAD  Neck: General: Supple without adenopathy. Thyroid: Thyroid size normal.  No goiter or nodules appreciated. No thyroid bruit.  Lungs: Clear with good BS bilat with no rales, rhonchi, or wheezes  Heart: RRR with normal S1 and S2 and no gallops; no murmurs; no rub  Abdomen: Normoactive bowel sounds, soft, nontender, without masses or organomegaly palpable  Extremities: No pretibial edema. No tremor.   Neuro: MS is good with appropriate affect, pt is alert and Ox3   DM Foot Exam 02/14/2020  The skin of the feet is intact without sores or ulcerations. The pedal pulses are 2+ on right and 2+ on left. The sensation is intact to a screening 5.07, 10 gram monofilament bilaterally     DATA REVIEWED:  Lab Results  Component Value Date   HGBA1C 7.8 (A) 02/14/2020   HGBA1C 7.7 (A) 11/01/2019      ASSESSMENT / PLAN / RECOMMENDATIONS:   1) Type 2 Diabetes Mellitus, Sub-Optimally controlled, With  complications - Most recent A1c of 7.8 %. Goal A1c < 7.0%.    - Slightly increased A1c due to medication non-adherence and dietary indiscretions due to recent stress of losing her job and having to find a new one. - We initially discussed switching Januvia to Rybelsus but  Her BMI is on the low normal and this will make her lose more weight and she is not interested in this at this time. - Will attempt to switch metformin to liquid , pt understands this could be cost prohibitive   MEDICATIONS: - Switch  Metformin 500 mg , 10 ML BID  - Increase Glimepiride 2 mg, TWO tablets with Breakfast  - Continue Januvia 100 mg daily    EDUCATION / INSTRUCTIONS:  BG monitoring instructions: Patient is instructed to check her blood sugars 2 times a day, supper and Breakfast.  Call [place] [facility] if: BG persistently < 70  . I reviewed the Rule of 15 for the treatment of hypoglycemia in detail with the patient. Literature supplied.    3)  Dyslipidemia : Patient is on simvastatin 20 mg daily . LDL above goal, this is due to  imperfect adherence. Discussed cardiovascular benefits and encouraged compliance in the past. Will continue to monitor      F/U in 4 months    Signed electronically by: 01/01/2020, MD  [facility] Endocrinology  [facility] [address] [place], [place] Phone: [phone] FAX: [phone]   CC: [phone], MD [address] [place] Phone: [phone]  Fax: [phone]  Return to Endocrinology clinic as below: Future Appointments  Date Time Provider Department Center  04/13/2020  1:30 PM [facility] DX  DEXA 1 [facility]

## 2020-02-14 NOTE — Patient Instructions (Signed)
-   Continue Metformin 500 mg , 2 tablet with breakfast and 2 tablet with supper - Glimepiride 2 mg, TWO tablets with Breakfast  - Continue Januvia 100 mg daily       HOW TO TREAT LOW BLOOD SUGARS (Blood sugar LESS THAN 70 MG/DL)  Please follow the RULE OF 15 for the treatment of hypoglycemia treatment (when your (blood sugars are less than 70 mg/dL)    STEP 1: Take 15 grams of carbohydrates when your blood sugar is low, which includes:   3-4 GLUCOSE TABS  OR  3-4 OZ OF JUICE OR REGULAR SODA OR  ONE TUBE OF GLUCOSE GEL     STEP 2: RECHECK blood sugar in 15 MINUTES STEP 3: If your blood sugar is still low at the 15 minute recheck --> then, go back to STEP 1 and treat AGAIN with another 15 grams of carbohydrates.

## 2020-02-17 DIAGNOSIS — F419 Anxiety disorder, unspecified: Secondary | ICD-10-CM | POA: Diagnosis not present

## 2020-02-18 ENCOUNTER — Telehealth: Payer: Self-pay

## 2020-02-18 NOTE — Telephone Encounter (Signed)
PA has been initiated AND approved via [email or web address] for Metformin 500mg /55ml liquid solution.  [employer]  PA Case ID: Ardyth Harps  Rx #: [id number] Status: Sent to [facility] Drug: metFORMIN HCl 500MG /5ML solution Form: [employer] Electronic PA Form (2017 NCPDP)   Approved: today Status:Approved Review Type:Prior Auth Coverage Start Date:01/19/2020 Coverage End Date:02/17/2021;

## 2020-03-18 DIAGNOSIS — F419 Anxiety disorder, unspecified: Secondary | ICD-10-CM | POA: Diagnosis not present

## 2020-04-07 DIAGNOSIS — F419 Anxiety disorder, unspecified: Secondary | ICD-10-CM | POA: Diagnosis not present

## 2020-04-13 ENCOUNTER — Other Ambulatory Visit: Payer: BC Managed Care – PPO

## 2020-04-27 DIAGNOSIS — F419 Anxiety disorder, unspecified: Secondary | ICD-10-CM | POA: Diagnosis not present

## 2020-04-28 DIAGNOSIS — E1165 Type 2 diabetes mellitus with hyperglycemia: Secondary | ICD-10-CM | POA: Diagnosis not present

## 2020-04-28 DIAGNOSIS — I10 Essential (primary) hypertension: Secondary | ICD-10-CM | POA: Diagnosis not present

## 2020-05-03 ENCOUNTER — Encounter: Payer: Self-pay | Admitting: Internal Medicine

## 2020-05-18 DIAGNOSIS — F419 Anxiety disorder, unspecified: Secondary | ICD-10-CM | POA: Diagnosis not present

## 2020-05-25 DIAGNOSIS — I10 Essential (primary) hypertension: Secondary | ICD-10-CM | POA: Diagnosis not present

## 2020-06-09 DIAGNOSIS — F419 Anxiety disorder, unspecified: Secondary | ICD-10-CM | POA: Diagnosis not present

## 2020-06-12 ENCOUNTER — Ambulatory Visit: Payer: BC Managed Care – PPO | Admitting: Internal Medicine

## 2020-06-15 DIAGNOSIS — M546 Pain in thoracic spine: Secondary | ICD-10-CM | POA: Diagnosis not present

## 2020-06-15 DIAGNOSIS — R109 Unspecified abdominal pain: Secondary | ICD-10-CM | POA: Diagnosis not present

## 2020-06-30 DIAGNOSIS — L720 Epidermal cyst: Secondary | ICD-10-CM | POA: Diagnosis not present

## 2020-06-30 DIAGNOSIS — L650 Telogen effluvium: Secondary | ICD-10-CM | POA: Diagnosis not present

## 2020-07-27 ENCOUNTER — Other Ambulatory Visit: Payer: BC Managed Care – PPO

## 2020-07-27 DIAGNOSIS — F419 Anxiety disorder, unspecified: Secondary | ICD-10-CM | POA: Diagnosis not present

## 2020-07-31 ENCOUNTER — Encounter: Payer: Self-pay | Admitting: Internal Medicine

## 2020-07-31 ENCOUNTER — Other Ambulatory Visit: Payer: Self-pay

## 2020-07-31 ENCOUNTER — Ambulatory Visit: Payer: BC Managed Care – PPO | Admitting: Internal Medicine

## 2020-07-31 VITALS — BP 134/82 | HR 104 | Ht 66.0 in | Wt 129.0 lb

## 2020-07-31 DIAGNOSIS — E1165 Type 2 diabetes mellitus with hyperglycemia: Secondary | ICD-10-CM

## 2020-07-31 LAB — POCT GLYCOSYLATED HEMOGLOBIN (HGB A1C): Hemoglobin A1C: 7.6 % — AB (ref 4.0–5.6)

## 2020-07-31 MED ORDER — GLIMEPIRIDE 4 MG PO TABS: 8.0000 mg | ORAL_TABLET | Freq: Every day | ORAL | 3 refills | Status: DC

## 2020-07-31 NOTE — Patient Instructions (Signed)
-   Continue Metformin 500 mg , 2 tablet with breakfast and 2 tablet with supper - Increase Glimepiride 4 mg,to  TWO tablets with Breakfast  - Continue Januvia 100 mg daily       HOW TO TREAT LOW BLOOD SUGARS (Blood sugar LESS THAN 70 MG/DL)  Please follow the RULE OF 15 for the treatment of hypoglycemia treatment (when your (blood sugars are less than 70 mg/dL)    STEP 1: Take 15 grams of carbohydrates when your blood sugar is low, which includes:   3-4 GLUCOSE TABS  OR  3-4 OZ OF JUICE OR REGULAR SODA OR  ONE TUBE OF GLUCOSE GEL     STEP 2: RECHECK blood sugar in 15 MINUTES STEP 3: If your blood sugar is still low at the 15 minute recheck --> then, go back to STEP 1 and treat AGAIN with another 15 grams of carbohydrates.

## 2020-07-31 NOTE — Progress Notes (Addendum)
Name: Rachel Dillon  Age/ Sex: 55 y.o., female   MRN/ DOB: [id number], 1966/03/08     PCP: Alysia Penna, MD   Reason for Endocrinology Evaluation: Type 2 Diabetes Mellitus  Initial Endocrine Consultative Visit: 11/01/2019    PATIENT IDENTIFIER: [name] is a 55 y.o. female with a past medical history of T2DM and dyslipidemia. The patient has followed with Endocrinology clinic since 11/01/2019 for consultative assistance with management of her diabetes.  DIABETIC HISTORY:  Rachel Dillon was diagnosed with T2DM in 2010.Has been on oral glycemic agents since diagnosis.  Her hemoglobin A1c has ranged from 6.1% in 2017, peaking at 8.7% in 2021  On her initial visit to our clinic she had an A1c 7.7% . She was on metformin, Januvia and Glimepiride. We discussed switching Januvia to Rybelsus but pt did not want to lose any more weight. We increased Glimepiride, continued metformin and Januvia   GAD-65 and iselt cell Ab's levels are negative     SUBJECTIVE:   During the last visit (02/14/2020): A1c 7.8 % We increased Glimepiride, continued metformin and Januvia    Today (07/31/2020): Rachel Dillon  She checks her blood sugars 2 times daily, preprandial to breakfast and supper. The patient has not had hypoglycemic episodes since the last clinic visit.   She had challenging months for November, and December, was started     HOME DIABETES REGIMEN:  Metformin 500 mg , 10 ML BID  Glimepiride 2 mg, TWO tablets with Breakfast - 1 tablet Twice daily  Januvia 100 mg daily       Statin: yes ACE-I/ARB: no   GLUCOSE LOG :  90 day average 188 mg/dL  [id number]  DIABETIC COMPLICATIONS: Microvascular complications:    Denies: retinopathy, neuropathy, CKD  Last Eye Exam: Completed 07/2019  Macrovascular complications:    Denies: CAD, CVA, PVD   HISTORY:   Past Medical History: No past medical history on file.  Past Surgical History: No past surgical history on  file.  Social History:  reports that she has never smoked. She has never used smokeless tobacco. She reports that she does not drink alcohol. No history on file for drug use.  Family History: No family history on file.   HOME MEDICATIONS: Allergies as of 07/31/2020   No Known Allergies     Medication List       Accurate as of July 31, 2020  8:16 AM. If you have any questions, ask your nurse or doctor.        COD LIVER OIL PO Take by mouth. daily   glimepiride 2 MG tablet Commonly known as: AMARYL HALF a tablet before Breakfast and ONE tablet before Supper   Metformin HCl 500 MG/5ML Soln Take 10 mLs (1,000 mg total) by mouth in the morning and at bedtime.   norethindrone-ethinyl estradiol 1-5 MG-MCG Tabs tablet Commonly known as: FEMHRT 1/5 norethindrone acetate 1 mg-ethinyl estradiol 5 mcg tablet  TK 1 T PO QD   simvastatin 20 MG tablet Commonly known as: ZOCOR simvastatin 20 mg tablet   sitaGLIPtin 100 MG tablet Commonly known as: Januvia Take 1 tablet (100 mg total) by mouth daily. E11.65   UNABLE TO FIND Med Name: livago meter   valACYclovir 500 MG tablet Commonly known as: VALTREX valacyclovir 500 mg tablet  Take 1 tablet every day by oral route.   Vitamin D (Ergocalciferol) 1.25 MG (50000 UNIT) Caps capsule Commonly known as: DRISDOL Take 50,000 Units by mouth 2 (two) times a week.  OBJECTIVE:   Vital Signs: BP 134/82   Pulse (!) 104   Ht 5\' 6"  (1.676 m)   Wt 129 lb (58.5 kg)   SpO2 98%   BMI 20.82 kg/m   Wt Readings from Last 3 Encounters:  07/31/20 129 lb (58.5 kg)  02/14/20 129 lb 9.6 oz (58.8 kg)  11/01/19 130 lb 9.6 oz (59.2 kg)     Exam: General: Pt appears well and is in NAD  Lungs: Clear with good BS bilat with no rales, rhonchi, or wheezes  Heart: RRR   Abdomen: Normoactive bowel sounds, soft, nontender, without masses or organomegaly palpable  Extremities: No pretibial edema. No tremor.   Neuro: MS is good with  appropriate affect, pt is alert and Ox3   DM Foot Exam 02/14/2020  The skin of the feet is intact without sores or ulcerations. The pedal pulses are 2+ on right and 2+ on left. The sensation is intact to a screening 5.07, 10 gram monofilament bilaterally     DATA REVIEWED:  Lab Results  Component Value Date   HGBA1C 7.6 (A) 07/31/2020   HGBA1C 7.8 (A) 02/14/2020   HGBA1C 7.7 (A) 11/01/2019   07/18/2020 LDL 120  HDL 60 Tg 60 BUN/Cr 7.7 mg BUN/Cr 16/0.810  ASSESSMENT / PLAN / RECOMMENDATIONS:   1) Type 2 Diabetes Mellitus, Sub-Optimally controlled, With  complications - Most recent A1c of 7.6 %. Goal A1c < 7.0%.    - Slight improvement in A1c   - We initially discussed switching Januvia to Rybelsus but  Her BMI is on the low normal and this will make her lose more weight and she is NOT interested in this at this time which would also apply to SGLT-2 inhibitors  - Will increase Glimepiride , as below, pt advised to take two tabslets before breakfast   MEDICATIONS:  - Metformin 500 mg , 10 ML BID  - Increase Glimepiride  To 4 mg, take TWO tablets with Breakfast  - Continue Januvia 100 mg daily    EDUCATION / INSTRUCTIONS:  BG monitoring instructions: Patient is instructed to check her blood sugars 2 times a day, supper and Breakfast.  Call [facility] if: BG persistently < 70  . I reviewed the Rule of 15 for the treatment of hypoglycemia in detail with the patient. Literature supplied.    3) Dyslipidemia : Patient is on simvastatin 20 mg daily . LDL above goal, this is due to  imperfect adherence. Discussed cardiovascular benefits and encouraged compliance in the past. Will continue to monitor      F/U in 4 months    Signed electronically by: 09-03-1983, MD  [facility]  [facility] [address] [place], [place] Phone: [phone] FAX: [phone]   CC: [phone],  MD [address] [place] Phone: [phone]  Fax: [phone]  Return to Endocrinology clinic as below: Future Appointments  Date Time Provider Department Center  11/30/2020  3:30 PM [facility] DX DEXA 1 [facility]

## 2020-08-18 DIAGNOSIS — F419 Anxiety disorder, unspecified: Secondary | ICD-10-CM | POA: Diagnosis not present

## 2020-11-11 ENCOUNTER — Other Ambulatory Visit: Payer: Self-pay | Admitting: Internal Medicine

## 2020-11-30 ENCOUNTER — Ambulatory Visit
Admission: RE | Admit: 2020-11-30 | Discharge: 2020-11-30 | Disposition: A | Discharge status: Home / Self Care | Payer: BC Managed Care – PPO | Source: Ambulatory Visit | Attending: Obstetrics and Gynecology | Admitting: Obstetrics and Gynecology

## 2020-11-30 ENCOUNTER — Other Ambulatory Visit: Payer: Self-pay

## 2020-11-30 DIAGNOSIS — E2839 Other primary ovarian failure: Secondary | ICD-10-CM

## 2020-11-30 DIAGNOSIS — M85852 Other specified disorders of bone density and structure, left thigh: Secondary | ICD-10-CM | POA: Diagnosis not present

## 2020-12-01 DIAGNOSIS — Z20822 Contact with and (suspected) exposure to covid-19: Secondary | ICD-10-CM | POA: Diagnosis not present

## 2020-12-06 DIAGNOSIS — R82998 Other abnormal findings in urine: Secondary | ICD-10-CM | POA: Diagnosis not present

## 2020-12-06 DIAGNOSIS — R319 Hematuria, unspecified: Secondary | ICD-10-CM | POA: Diagnosis not present

## 2020-12-11 ENCOUNTER — Ambulatory Visit: Payer: BC Managed Care – PPO | Admitting: Internal Medicine

## 2020-12-23 DIAGNOSIS — Z1231 Encounter for screening mammogram for malignant neoplasm of breast: Secondary | ICD-10-CM | POA: Diagnosis not present

## 2020-12-23 DIAGNOSIS — N390 Urinary tract infection, site not specified: Secondary | ICD-10-CM | POA: Diagnosis not present

## 2020-12-23 DIAGNOSIS — Z681 Body mass index (BMI) 19 or less, adult: Secondary | ICD-10-CM | POA: Diagnosis not present

## 2020-12-23 DIAGNOSIS — Z01419 Encounter for gynecological examination (general) (routine) without abnormal findings: Secondary | ICD-10-CM | POA: Diagnosis not present

## 2020-12-25 ENCOUNTER — Other Ambulatory Visit: Payer: Self-pay

## 2020-12-25 ENCOUNTER — Ambulatory Visit: Payer: BC Managed Care – PPO | Admitting: Internal Medicine

## 2020-12-25 ENCOUNTER — Encounter: Payer: Self-pay | Admitting: Internal Medicine

## 2020-12-25 VITALS — BP 126/88 | HR 106 | Ht 66.5 in | Wt 127.0 lb

## 2020-12-25 DIAGNOSIS — E1165 Type 2 diabetes mellitus with hyperglycemia: Secondary | ICD-10-CM

## 2020-12-25 LAB — POCT GLYCOSYLATED HEMOGLOBIN (HGB A1C): Hemoglobin A1C: 8 % — AB (ref 4.0–5.6)

## 2020-12-25 MED ORDER — PIOGLITAZONE HCL 15 MG PO TABS
15.0000 mg | ORAL_TABLET | Freq: Every day | ORAL | 1 refills | Status: DC
Start: 2020-12-25 — End: 2022-03-04

## 2020-12-25 NOTE — Patient Instructions (Signed)
-   Continue Metformin 500 mg , 2 tablet with breakfast and 2 tablet with supper - Continue Glimepiride 4 mg,TWO tablets with Breakfast  - Continue Januvia 100 mg daily  - Start Pioglitazone 15 mg, 1 tablet daily      HOW TO TREAT LOW BLOOD SUGARS (Blood sugar LESS THAN 70 MG/DL) Please follow the RULE OF 15 for the treatment of hypoglycemia treatment (when your (blood sugars are less than 70 mg/dL)   STEP 1: Take 15 grams of carbohydrates when your blood sugar is low, which includes:  3-4 GLUCOSE TABS  OR 3-4 OZ OF JUICE OR REGULAR SODA OR ONE TUBE OF GLUCOSE GEL    STEP 2: RECHECK blood sugar in 15 MINUTES STEP 3: If your blood sugar is still low at the 15 minute recheck --> then, go back to STEP 1 and treat AGAIN with another 15 grams of carbohydrates.

## 2020-12-25 NOTE — Progress Notes (Signed)
Name: Rachel Dillon  Age/ Sex: 55 y.o., female   MRN/ DOB: [id number], 03-May-1966     PCP: Alysia Penna, MD   Reason for Endocrinology Evaluation: Type 2 Diabetes Mellitus  Initial Endocrine Consultative Visit: 11/01/2019    PATIENT IDENTIFIER: Rachel Dillon is a 55 y.o. female with a past medical history of T2DM and dyslipidemia. The patient has followed with Endocrinology clinic since 11/01/2019 for consultative assistance with management of her diabetes.  DIABETIC HISTORY:  Rachel Dillon was diagnosed with T2DM in 2010.Has been on oral glycemic agents since diagnosis.  Her hemoglobin A1c has ranged from 6.1% in 2017, peaking at 8.7% in 2021  On her initial visit to our clinic she had an A1c 7.7% . She was on metformin, Januvia and Glimepiride. We discussed switching Januvia to Rybelsus but pt did not want to lose any more weight. We increased Glimepiride, continued metformin and Januvia   GAD-65 and iselt cell Ab's levels are negative     SUBJECTIVE:   During the last visit (07/31/2020): A1c 7.6% We increased Glimepiride, continued metformin and Januvia    Today (12/25/2020): Rachel Dillon is here for a  She checks her blood sugars 2 times daily, preprandial to breakfast and supper. The patient has not had hypoglycemic episodes since the last clinic visit.   She had challenging months for November, and December, was started     HOME DIABETES REGIMEN:  Metformin 500 mg , 10 ML BID  Glimepiride 4 mg, TWO tablets with Breakfast  Januvia 100 mg daily       Statin: yes ACE-I/ARB: no   GLUCOSE LOG :  90 day average 171 mg/dL  High 009 High   Before dinner  126 -269 Fasting 175 -   DIABETIC COMPLICATIONS: Microvascular complications:   Denies: retinopathy, neuropathy, CKD Last Eye Exam: Completed 07/2019  Macrovascular complications:   Denies: CAD, CVA, PVD   HISTORY:  Past Medical History: No past medical history on file. Past Surgical History: No past  surgical history on file. Social History:  reports that she has never smoked. She has never used smokeless tobacco. She reports that she does not drink alcohol. No history on file for drug use. Family History: No family history on file.   HOME MEDICATIONS: Allergies as of 12/25/2020   No Known Allergies      Medication List        Accurate as of December 25, 2020  8:32 AM. If you have any questions, ask your nurse or doctor.          COD LIVER OIL PO Take by mouth. daily   glimepiride 4 MG tablet Commonly known as: AMARYL Take 2 tablets (8 mg total) by mouth daily before breakfast.   Januvia 100 MG tablet Generic drug: sitaGLIPtin TAKE 1 TABLET(100 MG) BY MOUTH DAILY   Metformin HCl 500 MG/5ML Soln Take 10 mLs (1,000 mg total) by mouth in the morning and at bedtime.   norethindrone-ethinyl estradiol 1-5 MG-MCG Tabs tablet Commonly known as: FEMHRT 1/5 norethindrone acetate 1 mg-ethinyl estradiol 5 mcg tablet  TK 1 T PO QD   simvastatin 20 MG tablet Commonly known as: ZOCOR simvastatin 20 mg tablet   UNABLE TO FIND Med Name: livago meter   valACYclovir 500 MG tablet Commonly known as: VALTREX valacyclovir 500 mg tablet  Take 1 tablet every day by oral route.   Vitamin D (Ergocalciferol) 1.25 MG (50000 UNIT) Caps capsule Commonly known as: DRISDOL Take 50,000 Units by mouth 2 (  two) times a week.         OBJECTIVE:   Vital Signs: BP 126/88   Pulse (!) 106   Ht 5' 6.5" (1.689 m)   Wt 127 lb (57.6 kg)   SpO2 99%   BMI 20.19 kg/m   Wt Readings from Last 3 Encounters:  12/25/20 127 lb (57.6 kg)  07/31/20 129 lb (58.5 kg)  02/14/20 129 lb 9.6 oz (58.8 kg)     Exam: General: Pt appears well and is in NAD  Lungs: Clear with good BS bilat with no rales, rhonchi, or wheezes  Heart: RRR   Abdomen: Normoactive bowel sounds, soft, nontender, without masses or organomegaly palpable  Extremities: No pretibial edema. No tremor.   Neuro: MS is good with  appropriate affect, pt is alert and Ox3   DM Foot Exam 02/14/2020  The skin of the feet is intact without sores or ulcerations. The pedal pulses are 2+ on right and 2+ on left. The sensation is intact to a screening 5.07, 10 gram monofilament bilaterally     DATA REVIEWED:  Lab Results  Component Value Date   HGBA1C 8.0 (A) 12/25/2020   HGBA1C 7.6 (A) 07/31/2020   HGBA1C 7.8 (A) 02/14/2020   Results for Rachel Dillon, Rachel Dillon (MRN [id number]) as of 12/25/2020 08:31  Ref. Range 11/01/2019 08:17  Glutamic Acid Decarb Ab Latest Ref Range: <5 IU/mL <5    Results for Rachel Dillon, Rachel Dillon (MRN [id number]) as of 12/25/2020 08:31  Ref. Range 11/01/2019 08:17  ISLET CELL ANTIBODY SCREEN Latest Ref Range: NEGATIVE  NEGATIVE    07/18/2020 LDL 120  HDL 60 Tg 60 BUN/Cr 7.7 mg BUN/Cr 16/0.810  ASSESSMENT / PLAN / RECOMMENDATIONS:   1) Type 2 Diabetes Mellitus, Poorly  controlled, With  complications - Most recent A1c of 8.0 %. Goal A1c < 7.0%.    - A1c continues to be above goal  - She has dietary indiscretions , which we discussed examples today   - We had discussed switching Januvia to Rybelsus but  Her BMI is on the low normal and this will make her lose more weight and she is NOT interested in this at this time which would also apply to SGLT-2 inhibitors  - We discussed adding pioglitazone , discussed weight gain, edema and sister report with avandia and  bladder cancer . She is not a smoker, currently being treated by [facility] for UTI , still has trace blood in urine per pt   MEDICATIONS:  - Metformin 500 mg , 10 ML BID  - Continue Glimepiride  4 mg, take TWO tablets with Breakfast  - Continue Januvia 100 mg daily  - Start Pioglitazone 15 mg daily    EDUCATION / INSTRUCTIONS: BG monitoring instructions: Patient is instructed to check her blood sugars 2 times a day, supper and Breakfast. Call [facility] if: BG persistently < 70  I reviewed the Rule of 15 for the treatment of  hypoglycemia in detail with the patient. Literature supplied.    3) Dyslipidemia : Patient is on simvastatin 20 mg daily . LDL above goal, this is due to  imperfect adherence.  Will continue to monitor        F/U in 4 months    Signed electronically by: Lyndle Herrlich, MD  [facility]  [facility] [address] [place] [address] [place], [place] [address] Phone: [phone] FAX: [phone]   CC: Alysia Penna, MD [address] [place] [address] Phone: [phone]  Fax:  [phone]  Return to Endocrinology clinic as below: Future Appointments  Date Time Provider Department Center  02/02/2021  4:00 PM Lyn Records, MD [facility]

## 2021-01-07 DIAGNOSIS — Z961 Presence of intraocular lens: Secondary | ICD-10-CM | POA: Diagnosis not present

## 2021-01-07 DIAGNOSIS — E119 Type 2 diabetes mellitus without complications: Secondary | ICD-10-CM | POA: Diagnosis not present

## 2021-01-19 DIAGNOSIS — E785 Hyperlipidemia, unspecified: Secondary | ICD-10-CM | POA: Diagnosis not present

## 2021-01-19 DIAGNOSIS — E559 Vitamin D deficiency, unspecified: Secondary | ICD-10-CM | POA: Diagnosis not present

## 2021-01-19 DIAGNOSIS — E1165 Type 2 diabetes mellitus with hyperglycemia: Secondary | ICD-10-CM | POA: Diagnosis not present

## 2021-01-25 DIAGNOSIS — E1165 Type 2 diabetes mellitus with hyperglycemia: Secondary | ICD-10-CM | POA: Diagnosis not present

## 2021-01-25 DIAGNOSIS — I10 Essential (primary) hypertension: Secondary | ICD-10-CM | POA: Diagnosis not present

## 2021-01-25 DIAGNOSIS — Z1339 Encounter for screening examination for other mental health and behavioral disorders: Secondary | ICD-10-CM | POA: Diagnosis not present

## 2021-01-25 DIAGNOSIS — Z0000 Encounter for general adult medical examination without abnormal findings: Secondary | ICD-10-CM | POA: Diagnosis not present

## 2021-01-25 DIAGNOSIS — Z1331 Encounter for screening for depression: Secondary | ICD-10-CM | POA: Diagnosis not present

## 2021-01-29 DIAGNOSIS — B029 Zoster without complications: Secondary | ICD-10-CM | POA: Insufficient documentation

## 2021-02-02 ENCOUNTER — Encounter: Payer: Self-pay | Admitting: Interventional Cardiology

## 2021-02-02 ENCOUNTER — Ambulatory Visit: Payer: BC Managed Care – PPO | Admitting: Interventional Cardiology

## 2021-02-02 ENCOUNTER — Other Ambulatory Visit: Payer: Self-pay

## 2021-02-02 VITALS — BP 122/84 | HR 89 | Ht 66.5 in | Wt 127.4 lb

## 2021-02-02 DIAGNOSIS — I10 Essential (primary) hypertension: Secondary | ICD-10-CM

## 2021-02-02 DIAGNOSIS — E118 Type 2 diabetes mellitus with unspecified complications: Secondary | ICD-10-CM

## 2021-02-02 DIAGNOSIS — E785 Hyperlipidemia, unspecified: Secondary | ICD-10-CM | POA: Diagnosis not present

## 2021-02-02 DIAGNOSIS — F419 Anxiety disorder, unspecified: Secondary | ICD-10-CM

## 2021-02-02 NOTE — Progress Notes (Signed)
Cardiology Office Note:    Date:  02/02/2021   ID:  Rachel Dillon, DOB 27-Sep-1965, MRN [id number]  PCP:  Alysia Penna, MD  Cardiologist:  None   Referring MD: Alysia Penna, MD   Chief Complaint  Patient presents with   Hypertension   Follow-up    Diabetes mellitus type 2    History of Present Illness:    Rachel Dillon is a 55 y.o. female with a hx of diabetes and family history of hypertension referred for cardiac evaluation by [facility], Dr. Alysia Penna.  Rachel Dillon is 55 years old with greater than 10-year history of diabetes mellitus type 2 and recently noted to have significant elevation in blood pressure.  She was seen by Dr. Link Snuffer and started on ARB therapy initially with olmesartan and subsequently telmisartan.  The patient developed intolerance each medication due to weakness, lightheadedness, and sluggishness.  She eventually stopped the antihypertensive therapy and Thanksgiving 2021 and has not been back on therapy.  She was monitoring blood pressure on her own at home and was mostly less than 140/80 mmHg but had a lot of anxiety about measuring her blood pressures.  She was advised to stop taking them so frequently.  She denies chest pain, ankle edema, dyspnea on exertion, orthopnea, musculoskeletal discomfort, skin rash, and does not use nonsteroidal anti-inflammatory agents or alcohol.  Previously very active, walking, and exercise until she found out about the blood pressure.  She then became anxious about that and stopped using it.  Past Medical History:  Diagnosis Date   DM2 (diabetes mellitus, type 2) (HCC)    Shingles    Vitamin D deficiency     History reviewed. No pertinent surgical history.  Current Medications: Current Meds  Medication Sig   COD LIVER OIL PO Take by mouth. daily   Ergocalciferol (DRISDOL PO) Take 5,000 Units by mouth daily.   glimepiride (AMARYL) 4 MG tablet Take 2 tablets (8 mg total) by mouth daily  before breakfast.   JANUVIA 100 MG tablet TAKE 1 TABLET(100 MG) BY MOUTH DAILY   Metformin HCl 500 MG/5ML SOLN Take 10 mLs (1,000 mg total) by mouth in the morning and at bedtime.   norethindrone-ethinyl estradiol (FEMHRT 1/5) 1-5 MG-MCG TABS tablet norethindrone acetate 1 mg-ethinyl estradiol 5 mcg tablet  TK 1 T PO QD   pioglitazone (ACTOS) 15 MG tablet Take 1 tablet (15 mg total) by mouth daily.   simvastatin (ZOCOR) 20 MG tablet simvastatin 20 mg tablet   UNABLE TO FIND Med Name: livago meter   valACYclovir (VALTREX) 500 MG tablet valacyclovir 500 mg tablet  Take 1 tablet every day by oral route.     Allergies:   Patient has no known allergies.   Social History   Socioeconomic History   Marital status: Single    Spouse name: Not on file   Number of children: Not on file   Years of education: Not on file   Highest education level: Not on file  Occupational History   Not on file  Tobacco Use   Smoking status: Never   Smokeless tobacco: Never  Substance and Sexual Activity   Alcohol use: Never   Drug use: Not on file   Sexual activity: Not on file  Other Topics Concern   Not on file  Social History Narrative   Not on file   Social Determinants of Health   Financial Resource Strain: Not on file  Food Insecurity: Not on file  Transportation Needs: Not  on file  Physical Activity: Not on file  Stress: Not on file  Social Connections: Not on file     Family History: The patient's family history is not on file.  ROS:   Please see the history of present illness.    She denies claudication.  No history of stroke.  There is no peripheral edema.  No prior history of vascular disease.  Compliant with her current medical regimen.  Was on simvastatin but not had not been taking it on a regular basis.  Switching from Dr. Maureen Ralphs sent to Dr. Joan Mayans.  All other systems reviewed and are negative.  EKGs/Labs/Other Studies Reviewed:    The following studies were reviewed  today: No cardiac evaluation  EKG:  EKG normal sinus rhythm, no ST-T wave change, normal intervals, essentially normal EKG with voltage in the precordial leads suggestive of LVH.  Recent Labs: No results found for requested labs within last 8760 hours.  Recent Lipid Panel No results found for: CHOL, TRIG, HDL, CHOLHDL, VLDL, LDLCALC, LDLDIRECT  Physical Exam:    VS:  BP 122/84   Pulse 89   Ht 5' 6.5" (1.689 m)   Wt 127 lb 6.4 oz (57.8 kg)   SpO2 99%   BMI 20.25 kg/m     Wt Readings from Last 3 Encounters:  02/02/21 127 lb 6.4 oz (57.8 kg)  12/25/20 127 lb (57.6 kg)  07/31/20 129 lb (58.5 kg)     GEN: Slender.. No acute distress HEENT: Normal NECK: No JVD. LYMPHATICS: No lymphadenopathy CARDIAC: No murmur. RRR no gallop, or edema. VASCULAR:  Normal Pulses. No bruits. RESPIRATORY:  Clear to auscultation without rales, wheezing or rhonchi  ABDOMEN: Soft, non-tender, non-distended, No pulsatile mass, MUSCULOSKELETAL: No deformity  SKIN: Warm and dry NEUROLOGIC:  Alert and oriented x 3 PSYCHIATRIC:  Normal affect   ASSESSMENT:    1. Hypertension, unspecified type   2. Type 2 diabetes mellitus with complication, without long-term current use of insulin (HCC)   3. Hyperlipidemia LDL goal <70   4. Anxiety    PLAN:    In order of problems listed above:  2D Doppler echocardiogram and exercise treadmill testing to assess for chronicity of hypertension and severity.  After getting data to rule out LVH and seeing what happens to heart rate response and blood pressure on the treadmill, will make a targeted decision about blood pressure Dependent upon heart rate, systolic and diastolic pressure changes, and duration of blood pressure elevation and recovery. Would be nice to use an ARB although she has failed 2.  Perhaps they were too potent given her body habitus and weight. She is on simvastatin.  May not be taking the medication as recommended.  Target should be less than 70.   Most recent lipid with LDL 106.   Will have further recommendations after the stress test is performed.   Medication Adjustments/Labs and Tests Ordered: Current medicines are reviewed at length with the patient today.  Concerns regarding medicines are outlined above.  Orders Placed This Encounter  Procedures   Exercise Tolerance Test   EKG 12-Lead   ECHOCARDIOGRAM COMPLETE    No orders of the defined types were placed in this encounter.   Patient Instructions  Medication Instructions:  Your physician recommends that you continue on your current medications as directed. Please refer to the Current Medication list given to you today.  *If you need a refill on your cardiac medications before your next appointment, please call your pharmacy*  Lab Work: None If you have labs (blood work) drawn today and your tests are completely normal, you will receive your results only by: MyChart Message (if you have MyChart) OR A paper copy in the mail If you have any lab test that is abnormal or we need to change your treatment, we will call you to review the results.   Testing/Procedures: Your physician has requested that you have an exercise tolerance test. For further information please visit [email or web address] Please also follow instruction sheet, as given.  Your physician has requested that you have an echocardiogram. Echocardiography is a painless test that uses sound waves to create images of your heart. It provides your doctor with information about the size and shape of your heart and how well your heart's chambers and valves are working. This procedure takes approximately one hour. There are no restrictions for this procedure.   Follow-Up: At [facility], you and your health needs are our priority.  As part of our continuing mission to provide you with exceptional heart care, we have created designated Provider Care Teams.  These Care Teams include your primary Cardiologist  (physician) and Advanced Practice Providers (APPs -  Physician Assistants and Nurse Practitioners) who all work together to provide you with the care you need, when you need it.  We recommend signing up for the patient portal called "MyChart".  Sign up information is provided on this After Visit Summary.  MyChart is used to connect with patients for Virtual Visits (Telemedicine).  Patients are able to view lab/test results, encounter notes, upcoming appointments, etc.  Non-urgent messages can be sent to your provider as well.   To learn more about what you can do with MyChart, go to [email or web address].    Your next appointment:   As needed  The format for your next appointment:   In Person  Provider:   You may see Verdis Prime, MD or one of the following Advanced Practice Providers on your designated Care Team:   Nada Boozer, NP    Other Instructions     Signed, Lesleigh Noe, MD  02/02/2021 4:45 PM    [facility]

## 2021-02-02 NOTE — Patient Instructions (Signed)
Medication Instructions:  Your physician recommends that you continue on your current medications as directed. Please refer to the Current Medication list given to you today.  *If you need a refill on your cardiac medications before your next appointment, please call your pharmacy*   Lab Work: None If you have labs (blood work) drawn today and your tests are completely normal, you will receive your results only by: MyChart Message (if you have MyChart) OR A paper copy in the mail If you have any lab test that is abnormal or we need to change your treatment, we will call you to review the results.   Testing/Procedures: Your physician has requested that you have an exercise tolerance test. For further information please visit [email or web address] Please also follow instruction sheet, as given.  Your physician has requested that you have an echocardiogram. Echocardiography is a painless test that uses sound waves to create images of your heart. It provides your doctor with information about the size and shape of your heart and how well your heart's chambers and valves are working. This procedure takes approximately one hour. There are no restrictions for this procedure.   Follow-Up: At [facility], you and your health needs are our priority.  As part of our continuing mission to provide you with exceptional heart care, we have created designated Provider Care Teams.  These Care Teams include your primary Cardiologist (physician) and Advanced Practice Providers (APPs -  Physician Assistants and Nurse Practitioners) who all work together to provide you with the care you need, when you need it.  We recommend signing up for the patient portal called "MyChart".  Sign up information is provided on this After Visit Summary.  MyChart is used to connect with patients for Virtual Visits (Telemedicine).  Patients are able to view lab/test results, encounter notes, upcoming appointments, etc.  Non-urgent  messages can be sent to your provider as well.   To learn more about what you can do with MyChart, go to [email or web address].    Your next appointment:   As needed  The format for your next appointment:   In Person  Provider:   You may see Verdis Prime, MD or one of the following Advanced Practice Providers on your designated Care Team:   Nada Boozer, NP    Other Instructions

## 2021-02-16 ENCOUNTER — Ambulatory Visit (INDEPENDENT_AMBULATORY_CARE_PROVIDER_SITE_OTHER): Payer: BC Managed Care – PPO

## 2021-02-16 ENCOUNTER — Other Ambulatory Visit: Payer: Self-pay

## 2021-02-16 DIAGNOSIS — I10 Essential (primary) hypertension: Secondary | ICD-10-CM | POA: Diagnosis not present

## 2021-02-17 LAB — EXERCISE TOLERANCE TEST
Angina Index: 0
Base ST Depression (mm): 0 mm
Duke Treadmill Score: 7
Estimated workload: 8.5
Exercise duration (min): 7 min
Exercise duration (sec): 0 s
MPHR: 166 {beats}/min
Peak HR: 157 {beats}/min
Percent HR: 94 %
RPE: 17
Rest HR: 89 {beats}/min
ST Depression (mm): 0 mm
ST Elevation (mm): 0 mm

## 2021-02-18 ENCOUNTER — Telehealth: Payer: Self-pay | Admitting: Interventional Cardiology

## 2021-02-18 DIAGNOSIS — I10 Essential (primary) hypertension: Secondary | ICD-10-CM

## 2021-02-18 MED ORDER — METOPROLOL SUCCINATE ER 25 MG PO TB24: 25.0000 mg | ORAL_TABLET | Freq: Every day | ORAL | 2 refills | Status: DC

## 2021-02-18 MED ORDER — HYDROCHLOROTHIAZIDE 12.5 MG PO CAPS: 12.5000 mg | ORAL_CAPSULE | Freq: Every day | ORAL | 2 refills | Status: DC

## 2021-02-18 NOTE — Telephone Encounter (Signed)
Lyn Records, MD  Julio Sicks, RN; Alysia Penna, MD Let the patient know the BP is terribly elevated and not related to anxiety. Probably feels bad on therapy from lowering BP. Body will need to get adjusted and she will need to be patient with how she feels.  Recommend starting Metoprolol XL 25 mg daily and Hctz 12.5 mg daily. Needs 1 week f/u in BP clinic and BMET.  A copy will be sent to Alysia Penna, MD    Spoke with pt and made her aware of results and recommendations.  Pt agreeable to plan.  Unable to come in next week but will come in to see PharmD team and gets labs on 9/7 and continue with plan for echo on 9/8.  Advised pt to monitor BP daily and bring those readings along with her cuff to the appt.  Advised to check BP is any symptoms.  Pt appreciative for call.

## 2021-02-18 NOTE — Telephone Encounter (Signed)
  Pt is calling back to get result  

## 2021-02-19 ENCOUNTER — Other Ambulatory Visit: Payer: Self-pay | Admitting: Internal Medicine

## 2021-02-24 ENCOUNTER — Encounter: Payer: Self-pay | Admitting: Internal Medicine

## 2021-02-25 ENCOUNTER — Telehealth: Payer: Self-pay | Admitting: Pharmacy Technician

## 2021-02-25 NOTE — Telephone Encounter (Signed)
[facility] Patient Advocate Encounter  Prior Authorization for METFORMIN SOLUTION has been approved.    PA# :[id number]   Effective dates:  through 02/27/2022    [facility] will continue to follow.   Jeannette How, CPhT Patient Advocate [facility] Phone: [phone] Fax:  [phone]

## 2021-03-03 ENCOUNTER — Ambulatory Visit: Payer: BC Managed Care – PPO

## 2021-03-03 ENCOUNTER — Other Ambulatory Visit: Payer: BC Managed Care – PPO

## 2021-03-04 ENCOUNTER — Other Ambulatory Visit (HOSPITAL_COMMUNITY): Payer: BC Managed Care – PPO

## 2021-03-15 ENCOUNTER — Ambulatory Visit (HOSPITAL_COMMUNITY): Payer: BC Managed Care – PPO | Attending: Cardiology

## 2021-03-15 ENCOUNTER — Other Ambulatory Visit: Payer: Self-pay

## 2021-03-15 DIAGNOSIS — I10 Essential (primary) hypertension: Secondary | ICD-10-CM

## 2021-03-15 LAB — ECHOCARDIOGRAM COMPLETE
Area-P 1/2: 4.04 cm2
S' Lateral: 2.7 cm

## 2021-03-23 ENCOUNTER — Ambulatory Visit: Payer: BC Managed Care – PPO

## 2021-03-23 ENCOUNTER — Other Ambulatory Visit: Payer: BC Managed Care – PPO

## 2021-04-12 DIAGNOSIS — R110 Nausea: Secondary | ICD-10-CM | POA: Diagnosis not present

## 2021-04-12 DIAGNOSIS — R42 Dizziness and giddiness: Secondary | ICD-10-CM | POA: Diagnosis not present

## 2021-04-12 DIAGNOSIS — H6123 Impacted cerumen, bilateral: Secondary | ICD-10-CM | POA: Diagnosis not present

## 2021-04-16 DIAGNOSIS — R42 Dizziness and giddiness: Secondary | ICD-10-CM | POA: Diagnosis not present

## 2021-04-16 DIAGNOSIS — H6690 Otitis media, unspecified, unspecified ear: Secondary | ICD-10-CM | POA: Diagnosis not present

## 2021-04-27 DIAGNOSIS — F419 Anxiety disorder, unspecified: Secondary | ICD-10-CM | POA: Diagnosis not present

## 2021-04-30 ENCOUNTER — Other Ambulatory Visit: Payer: Self-pay

## 2021-04-30 ENCOUNTER — Encounter: Payer: Self-pay | Admitting: Internal Medicine

## 2021-04-30 ENCOUNTER — Ambulatory Visit: Payer: BC Managed Care – PPO | Admitting: Internal Medicine

## 2021-04-30 VITALS — BP 124/76 | HR 78 | Ht 66.5 in | Wt 125.8 lb

## 2021-04-30 DIAGNOSIS — E785 Hyperlipidemia, unspecified: Secondary | ICD-10-CM

## 2021-04-30 DIAGNOSIS — E1165 Type 2 diabetes mellitus with hyperglycemia: Secondary | ICD-10-CM

## 2021-04-30 LAB — POCT GLYCOSYLATED HEMOGLOBIN (HGB A1C): Hemoglobin A1C: 7.5 % — AB (ref 4.0–5.6)

## 2021-04-30 NOTE — Progress Notes (Signed)
Name: Rachel Dillon  Age/ Sex: 55 y.o., female   MRN/ DOB: [id number], 1966-02-28     PCP: Alysia Penna, MD   Reason for Endocrinology Evaluation: Type 2 Diabetes Mellitus  Initial Endocrine Consultative Visit: 11/01/2019    PATIENT IDENTIFIER: [name] is a 55 y.o. female with a past medical history of T2DM and dyslipidemia. The patient has followed with Endocrinology clinic since 11/01/2019 for consultative assistance with management of her diabetes.  DIABETIC HISTORY:  Rachel Dillon was diagnosed with T2DM in 2010.Has been on oral glycemic agents since diagnosis.  Her hemoglobin A1c has ranged from 6.1% in 2017, peaking at 8.7% in 2021  On her initial visit to our clinic she had an A1c 7.7% . She was on metformin, Januvia and Glimepiride. We discussed switching Januvia to Rybelsus but pt did not want to lose any more weight. We increased Glimepiride, continued metformin and Januvia   Started pioglitazone 12/2020   GAD-65 and iselt cell Ab's levels are negative     SUBJECTIVE:   During the last visit (12/25/2020): A1c 8.0% We continued  Glimepiride, metformin and Januvia started pioglitazone    Today (04/30/2021): Rachel Dillon is here for a  She checks her blood sugars 2 times daily, preprandial to breakfast and supper. The patient has not had hypoglycemic episodes since the last clinic visit.     Weight has been stable   She is not taking Pioglitazone  She changed her diet and exercising  Denies nausea, vomiting or diarrhea   HOME DIABETES REGIMEN:  Metformin 500 mg , 10 ML BID  Glimepiride 4 mg, TWO tablets with Breakfast  Januvia 100 mg daily  Pioglitazone 15 mg daily - not taking      Statin: yes ACE-I/ARB: no   GLUCOSE LOG :  7 day average 151 mg/dL   28-786 mg/dL   DIABETIC COMPLICATIONS: Microvascular complications:   Denies: retinopathy, neuropathy, CKD Last Eye Exam: Completed 12/2020  Macrovascular complications:   Denies: CAD, CVA,  PVD   HISTORY:  Past Medical History:  Past Medical History:  Diagnosis Date   DM2 (diabetes mellitus, type 2) (HCC)    Shingles    Vitamin D deficiency    Past Surgical History: No past surgical history on file. Social History:  reports that she has never smoked. She has never used smokeless tobacco. She reports that she does not drink alcohol. No history on file for drug use. Family History: No family history on file.   HOME MEDICATIONS: Allergies as of 04/30/2021   No Known Allergies      Medication List        Accurate as of April 30, 2021  7:53 AM. If you have any questions, ask your nurse or doctor.          COD LIVER OIL PO Take by mouth. daily   DRISDOL PO Take 5,000 Units by mouth daily. What changed: Another medication with the same name was removed. Continue taking this medication, and follow the directions you see here. Changed by: Scarlette Shorts, MD   glimepiride 4 MG tablet Commonly known as: AMARYL Take 2 tablets (8 mg total) by mouth daily before breakfast.   hydrochlorothiazide 12.5 MG capsule Commonly known as: MICROZIDE Take 1 capsule (12.5 mg total) by mouth daily.   Januvia 100 MG tablet Generic drug: sitaGLIPtin TAKE 1 TABLET(100 MG) BY MOUTH DAILY   Metformin HCl 500 MG/5ML Soln TAKE 10 ML(1000 MG) BY MOUTH IN THE MORNING AND AT BEDTIME  metoprolol succinate 25 MG 24 hr tablet Commonly known as: Toprol XL Take 1 tablet (25 mg total) by mouth daily.   norethindrone-ethinyl estradiol 1-5 MG-MCG Tabs tablet Commonly known as: FEMHRT 1/5 norethindrone acetate 1 mg-ethinyl estradiol 5 mcg tablet  TK 1 T PO QD   pioglitazone 15 MG tablet Commonly known as: Actos Take 1 tablet (15 mg total) by mouth daily.   simvastatin 20 MG tablet Commonly known as: ZOCOR simvastatin 20 mg tablet   UNABLE TO FIND Med Name: livago meter   valACYclovir 500 MG tablet Commonly known as: VALTREX valacyclovir 500 mg tablet  Take 1  tablet every day by oral route.         OBJECTIVE:   Vital Signs: BP 124/76 (BP Location: Left Arm, Patient Position: Sitting, Cuff Size: Small)   Pulse 78   Ht 5' 6.5" (1.689 m)   Wt 125 lb 12.8 oz (57.1 kg)   SpO2 96%   BMI 20.00 kg/m   Wt Readings from Last 3 Encounters:  04/30/21 125 lb 12.8 oz (57.1 kg)  02/02/21 127 lb 6.4 oz (57.8 kg)  12/25/20 127 lb (57.6 kg)     Exam: General: Pt appears well and is in NAD  Lungs: Clear with good BS bilat with no rales, rhonchi, or wheezes  Heart: RRR   Abdomen: Normoactive bowel sounds, soft, nontender, without masses or organomegaly palpable  Extremities: No pretibial edema. No tremor.   Neuro: MS is good with appropriate affect, pt is alert and Ox3   DM Foot Exam 04/30/2021  The skin of the feet is intact without sores or ulcerations. The pedal pulses are 2+ on right and 2+ on left. The sensation is intact to a screening 5.07, 10 gram monofilament bilaterally     DATA REVIEWED:  Lab Results  Component Value Date   HGBA1C 7.5 (A) 04/30/2021   HGBA1C 8.0 (A) 12/25/2020   HGBA1C 7.6 (A) 07/31/2020   Results for KAMBRE, MESSNER (MRN [id number]) as of 12/25/2020 08:31  Ref. Range 11/01/2019 08:17  Glutamic Acid Decarb Ab Latest Ref Range: <5 IU/mL <5    Results for ASHETON, VIRAMONTES (MRN [id number]) as of 12/25/2020 08:31  Ref. Range 11/01/2019 08:17  ISLET CELL ANTIBODY SCREEN Latest Ref Range: NEGATIVE  NEGATIVE    07/18/2020 LDL 120  HDL 60 Tg 60 BUN/Cr 7.7 mg BUN/Cr 16/0.810  ASSESSMENT / PLAN / RECOMMENDATIONS:   1) Type 2 Diabetes Mellitus, Poorly  controlled, With  complications - Most recent A1c of 7.5  %. Goal A1c < 7.0%.    - A1c  trending down  - She had made drastic dietary and lifestyle changes - She has NOT started Pioglitazone, she would like to continue with lifestyle changes  - We had discussed switching Januvia to Rybelsus , but she is NOT interested in any medications that would exacerbate weight  loss, including SGLT-2 inhibitors.  - I am going to keep Pioglitazone on her list and encouraged her to take it if hyperglycemia remains   MEDICATIONS:  - Metformin 500 mg , 10 ML BID  - Continue Glimepiride  4 mg, take TWO tablets with Breakfast  - Continue Januvia 100 mg daily  - Start Pioglitazone 15 mg daily    EDUCATION / INSTRUCTIONS: BG monitoring instructions: Patient is instructed to check her blood sugars 2 times a day, supper and Breakfast. Call [facility] if: BG persistently < 70  I reviewed the Rule of 15 for the treatment of hypoglycemia in  detail with the patient. Literature supplied.    3) Dyslipidemia : Patient is on simvastatin 20 mg daily . LDL continues to be above goal , she continues with imperfect adherence to statin intake. We again discussed his risk of CVA and CVD in diabetic population and statin therapy improves this risk .  - I have again encouraged her to take simvastatin daily   Medication  Simvastatin 20 mg daily   F/U in 4 months    Signed electronically by: Lyndle Herrlich, MD  [facility]  [facility] [address] [place], [place] [address] Phone: [phone] FAX: [phone]   CC: Alysia Penna, MD [address] [place] [address] Phone: [phone]  Fax: [phone]  Return to Endocrinology clinic as below: No future appointments.

## 2021-04-30 NOTE — Patient Instructions (Addendum)
-   Continue Metformin 500 mg , 2 tablet with breakfast and 2 tablet with supper - Continue Glimepiride 4 mg,TWO tablets with Breakfast  - Continue Januvia 100 mg daily  - I will keep Pioglitazone on your list 15 mg daily     HOW TO TREAT LOW BLOOD SUGARS (Blood sugar LESS THAN 70 MG/DL) Please follow the RULE OF 15 for the treatment of hypoglycemia treatment (when your (blood sugars are less than 70 mg/dL)   STEP 1: Take 15 grams of carbohydrates when your blood sugar is low, which includes:  3-4 GLUCOSE TABS  OR 3-4 OZ OF JUICE OR REGULAR SODA OR ONE TUBE OF GLUCOSE GEL    STEP 2: RECHECK blood sugar in 15 MINUTES STEP 3: If your blood sugar is still low at the 15 minute recheck --> then, go back to STEP 1 and treat AGAIN with another 15 grams of carbohydrates.

## 2021-05-12 DIAGNOSIS — Z1212 Encounter for screening for malignant neoplasm of rectum: Secondary | ICD-10-CM | POA: Diagnosis not present

## 2021-05-18 DIAGNOSIS — F419 Anxiety disorder, unspecified: Secondary | ICD-10-CM | POA: Diagnosis not present

## 2021-06-01 DIAGNOSIS — K219 Gastro-esophageal reflux disease without esophagitis: Secondary | ICD-10-CM | POA: Diagnosis not present

## 2021-06-24 DIAGNOSIS — F419 Anxiety disorder, unspecified: Secondary | ICD-10-CM | POA: Diagnosis not present

## 2021-07-01 DIAGNOSIS — L659 Nonscarring hair loss, unspecified: Secondary | ICD-10-CM | POA: Diagnosis not present

## 2021-07-01 DIAGNOSIS — E119 Type 2 diabetes mellitus without complications: Secondary | ICD-10-CM | POA: Diagnosis not present

## 2021-07-01 DIAGNOSIS — R790 Abnormal level of blood mineral: Secondary | ICD-10-CM | POA: Diagnosis not present

## 2021-07-01 DIAGNOSIS — E538 Deficiency of other specified B group vitamins: Secondary | ICD-10-CM | POA: Diagnosis not present

## 2021-07-01 DIAGNOSIS — N951 Menopausal and female climacteric states: Secondary | ICD-10-CM | POA: Diagnosis not present

## 2021-07-01 DIAGNOSIS — K5900 Constipation, unspecified: Secondary | ICD-10-CM | POA: Diagnosis not present

## 2021-07-01 DIAGNOSIS — E559 Vitamin D deficiency, unspecified: Secondary | ICD-10-CM | POA: Diagnosis not present

## 2021-07-01 DIAGNOSIS — I10 Essential (primary) hypertension: Secondary | ICD-10-CM | POA: Diagnosis not present

## 2021-07-14 DIAGNOSIS — F419 Anxiety disorder, unspecified: Secondary | ICD-10-CM | POA: Diagnosis not present

## 2021-07-16 ENCOUNTER — Other Ambulatory Visit: Payer: Self-pay | Admitting: Internal Medicine

## 2021-07-26 DIAGNOSIS — R790 Abnormal level of blood mineral: Secondary | ICD-10-CM | POA: Diagnosis not present

## 2021-07-26 DIAGNOSIS — K5900 Constipation, unspecified: Secondary | ICD-10-CM | POA: Diagnosis not present

## 2021-07-26 DIAGNOSIS — I10 Essential (primary) hypertension: Secondary | ICD-10-CM | POA: Diagnosis not present

## 2021-07-26 DIAGNOSIS — L659 Nonscarring hair loss, unspecified: Secondary | ICD-10-CM | POA: Diagnosis not present

## 2021-08-03 DIAGNOSIS — E119 Type 2 diabetes mellitus without complications: Secondary | ICD-10-CM | POA: Diagnosis not present

## 2021-08-03 DIAGNOSIS — N951 Menopausal and female climacteric states: Secondary | ICD-10-CM | POA: Diagnosis not present

## 2021-08-03 DIAGNOSIS — E538 Deficiency of other specified B group vitamins: Secondary | ICD-10-CM | POA: Diagnosis not present

## 2021-08-03 DIAGNOSIS — E559 Vitamin D deficiency, unspecified: Secondary | ICD-10-CM | POA: Diagnosis not present

## 2021-08-03 DIAGNOSIS — R790 Abnormal level of blood mineral: Secondary | ICD-10-CM | POA: Diagnosis not present

## 2021-08-03 DIAGNOSIS — I10 Essential (primary) hypertension: Secondary | ICD-10-CM | POA: Diagnosis not present

## 2021-08-06 DIAGNOSIS — K219 Gastro-esophageal reflux disease without esophagitis: Secondary | ICD-10-CM | POA: Diagnosis not present

## 2021-08-06 DIAGNOSIS — K5900 Constipation, unspecified: Secondary | ICD-10-CM | POA: Diagnosis not present

## 2021-08-06 DIAGNOSIS — K921 Melena: Secondary | ICD-10-CM | POA: Diagnosis not present

## 2021-08-18 DIAGNOSIS — F419 Anxiety disorder, unspecified: Secondary | ICD-10-CM | POA: Diagnosis not present

## 2021-08-23 ENCOUNTER — Other Ambulatory Visit: Payer: Self-pay | Admitting: Internal Medicine

## 2021-09-03 ENCOUNTER — Ambulatory Visit: Payer: BC Managed Care – PPO | Admitting: Internal Medicine

## 2021-09-15 DIAGNOSIS — F419 Anxiety disorder, unspecified: Secondary | ICD-10-CM | POA: Diagnosis not present

## 2021-09-20 DIAGNOSIS — K921 Melena: Secondary | ICD-10-CM | POA: Diagnosis not present

## 2021-09-30 ENCOUNTER — Encounter: Payer: Self-pay | Admitting: Internal Medicine

## 2021-10-03 ENCOUNTER — Other Ambulatory Visit: Payer: Self-pay

## 2021-10-06 DIAGNOSIS — F419 Anxiety disorder, unspecified: Secondary | ICD-10-CM | POA: Diagnosis not present

## 2021-10-08 DIAGNOSIS — K5900 Constipation, unspecified: Secondary | ICD-10-CM | POA: Diagnosis not present

## 2021-10-08 DIAGNOSIS — E119 Type 2 diabetes mellitus without complications: Secondary | ICD-10-CM | POA: Diagnosis not present

## 2021-10-08 DIAGNOSIS — I10 Essential (primary) hypertension: Secondary | ICD-10-CM | POA: Diagnosis not present

## 2021-10-08 DIAGNOSIS — L659 Nonscarring hair loss, unspecified: Secondary | ICD-10-CM | POA: Diagnosis not present

## 2021-10-08 DIAGNOSIS — R790 Abnormal level of blood mineral: Secondary | ICD-10-CM | POA: Diagnosis not present

## 2021-10-08 DIAGNOSIS — E785 Hyperlipidemia, unspecified: Secondary | ICD-10-CM | POA: Diagnosis not present

## 2021-10-18 ENCOUNTER — Encounter: Payer: Self-pay | Admitting: Internal Medicine

## 2021-10-22 ENCOUNTER — Ambulatory Visit: Payer: BC Managed Care – PPO | Admitting: Internal Medicine

## 2021-10-22 ENCOUNTER — Encounter: Payer: Self-pay | Admitting: Internal Medicine

## 2021-10-22 VITALS — BP 120/80 | HR 82 | Ht 66.5 in | Wt 131.0 lb

## 2021-10-22 DIAGNOSIS — E1165 Type 2 diabetes mellitus with hyperglycemia: Secondary | ICD-10-CM | POA: Diagnosis not present

## 2021-10-22 DIAGNOSIS — E785 Hyperlipidemia, unspecified: Secondary | ICD-10-CM | POA: Diagnosis not present

## 2021-10-22 DIAGNOSIS — R739 Hyperglycemia, unspecified: Secondary | ICD-10-CM | POA: Diagnosis not present

## 2021-10-22 LAB — BASIC METABOLIC PANEL
BUN: 15 mg/dL (ref 6–23)
CO2: 27 mEq/L (ref 19–32)
Calcium: 9.2 mg/dL (ref 8.4–10.5)
Chloride: 101 mEq/L (ref 96–112)
Creatinine, Ser: 0.8 mg/dL (ref 0.40–1.20)
GFR: 82.88 mL/min (ref >60.00)
Glucose, Bld: 175 mg/dL — ABNORMAL HIGH (ref 70–99)
Potassium: 4.3 mEq/L (ref 3.5–5.1)
Sodium: 138 mEq/L (ref 135–145)

## 2021-10-22 LAB — POCT GLYCOSYLATED HEMOGLOBIN (HGB A1C): Hemoglobin A1C: 7.8 % — AB (ref 4.0–5.6)

## 2021-10-22 MED ORDER — GLIPIZIDE 10 MG PO TABS: 10.0000 mg | ORAL_TABLET | Freq: Two times a day (BID) | ORAL | 3 refills | Status: DC

## 2021-10-22 MED ORDER — ATORVASTATIN CALCIUM 20 MG PO TABS: 20.0000 mg | ORAL_TABLET | Freq: Every day | ORAL | 3 refills | Status: AC

## 2021-10-22 MED ORDER — SITAGLIPTIN PHOSPHATE 100 MG PO TABS: 100.0000 mg | ORAL_TABLET | Freq: Every day | ORAL | 3 refills | Status: DC

## 2021-10-22 NOTE — Progress Notes (Signed)
?Name: Rachel Dillon  ?Age/ Sex: 56 y.o., female   ?MRN/ DOB: [id number], [birth year]    ? ?PCP: Alysia Penna, MD   ?Reason for Endocrinology Evaluation: Type 2 Diabetes Mellitus  ?Initial Endocrine Consultative Visit: 11/01/2019  ? ? ?PATIENT IDENTIFIER: Rachel Dillon is a 56 y.o. female with a past medical history of T2DM and dyslipidemia. The patient has followed with Endocrinology clinic since 11/01/2019 for consultative assistance with management of her diabetes. ? ?DIABETIC HISTORY:  ?Rachel Dillon was diagnosed with T2DM in 2010.Has been on oral glycemic agents since diagnosis.  Her hemoglobin A1c has ranged from 6.1% in 2017, peaking at 8.7% in 2021 ? ?On her initial visit to our clinic she had an A1c 7.7% . She was on metformin, Januvia and Glimepiride. We discussed switching Januvia to Rybelsus but pt did not want to lose any more weight. We increased Glimepiride, continued metformin and Januvia  ? ?Started pioglitazone 12/2020 ? ? ?GAD-65 and iselt cell Ab's levels are negative  ? ?Started pioglitazone 04/2026 ? ?SUBJECTIVE:  ? ?During the last visit (04/30/2021): A1c 7.5 % We continued  Glimepiride, metformin and Januvia re-started pioglitazone  ? ? ?Today (10/22/2021): Rachel Dillon is here for a  She checks her blood sugars 2 times daily, preprandial to breakfast and supper. The patient has not had hypoglycemic episodes since the last clinic visit.  ? ? ? ?Weight has been stable  ? ?She is not taking Pioglitazone  ?She continues to work on diet and exercise but continues with hyperglycemia ?Denies nausea, vomiting or diarrhea  ? ?She had labs at [facility] 2 weeks with an A1c 8.1 % , LDL 101 mg/dL  ? ? ?HOME DIABETES REGIMEN:  ?Metformin 500 mg , 10 ML BID  ?Glimepiride 4 mg, TWO tablets with Breakfast  ?Januvia 100 mg daily  ?Pioglitazone 15 mg daily  ? ? ? ? ?Statin: yes ?ACE-I/ARB: no ? ? ?GLUCOSE LOG : unable to download ?95-205 MG/DL ? ? ? ?DIABETIC COMPLICATIONS: ?Microvascular complications:   ? ?Denies: retinopathy, neuropathy, CKD ?Last Eye Exam: Completed 12/2020 ? ?Macrovascular complications:  ? ?Denies: CAD, CVA, PVD ? ? ?HISTORY:  ?Past Medical History:  ?Past Medical History:  ?Diagnosis Date  ? DM2 (diabetes mellitus, type 2) (HCC)   ? Shingles   ? Vitamin D deficiency   ? ?Past Surgical History: No past surgical history on file. ?Social History:  reports that she has never smoked. She has never used smokeless tobacco. She reports that she does not drink alcohol. No history on file for drug use. ?Family History: No family history on file. ? ? ?HOME MEDICATIONS: ?Allergies as of 10/22/2021   ?No Known Allergies ?  ? ?  ?Medication List  ?  ? ?  ? Accurate as of October 22, 2021  8:00 AM. If you have any questions, ask your nurse or doctor.  ?  ?  ? ?  ? ?COD LIVER OIL PO ?Take by mouth. daily ?  ?DRISDOL PO ?Take 5,000 Units by mouth daily. ?  ?glimepiride 4 MG tablet ?Commonly known as: AMARYL ?TAKE 2 TABLETS(8 MG) BY MOUTH DAILY BEFORE BREAKFAST ?  ?hydrochlorothiazide 12.5 MG capsule ?Commonly known as: MICROZIDE ?Take 1 capsule (12.5 mg total) by mouth daily. ?  ?Januvia 100 MG tablet ?Generic drug: sitaGLIPtin ?TAKE 1 TABLET(100 MG) BY MOUTH DAILY ?  ?Metformin HCl 500 MG/5ML Soln ?TAKE 10 ML(1000 MG) BY MOUTH IN THE MORNING AND AT BEDTIME ?  ?metoprolol succinate 25 MG 24 hr tablet ?  Commonly known as: Toprol XL ?Take 1 tablet (25 mg total) by mouth daily. ?  ?norethindrone-ethinyl estradiol 1-5 MG-MCG Tabs tablet ?Commonly known as: FEMHRT 1/5 ?norethindrone acetate 1 mg-ethinyl estradiol 5 mcg tablet ? TK 1 T PO QD ?  ?pioglitazone 15 MG tablet ?Commonly known as: Actos ?Take 1 tablet (15 mg total) by mouth daily. ?  ?simvastatin 20 MG tablet ?Commonly known as: ZOCOR ?simvastatin 20 mg tablet ?  ?UNABLE TO FIND ?Med Name: livago meter ?  ?valACYclovir 500 MG tablet ?Commonly known as: VALTREX ?valacyclovir 500 mg tablet ? Take 1 tablet every day by oral route. ?  ? ?  ? ? ? ?OBJECTIVE:   ? ?Vital Signs: BP 120/80 (BP Location: Left Arm, Patient Position: Sitting, Cuff Size: Small)   Pulse 82   Ht 5' 6.5" (1.689 m)   Wt 131 lb (59.4 kg)   SpO2 99%   BMI 20.83 kg/m?   ?Wt Readings from Last 3 Encounters:  ?10/22/21 131 lb (59.4 kg)  ?04/30/21 125 lb 12.8 oz (57.1 kg)  ?02/02/21 127 lb 6.4 oz (57.8 kg)  ? ? ? ?Exam: ?General: Pt appears well and is in NAD  ?Lungs: Clear with good BS bilat with no rales, rhonchi, or wheezes  ?Heart: RRR   ?Abdomen: Normoactive bowel sounds, soft, nontender, without masses or organomegaly palpable  ?Extremities: No pretibial edema. No tremor.   ?Neuro: MS is good with appropriate affect, pt is alert and Ox3  ? ?DM Foot Exam 04/30/2021 ? ?The skin of the feet is intact without sores or ulcerations. ?The pedal pulses are 2+ on right and 2+ on left. ?The sensation is intact to a screening 5.07, 10 gram monofilament bilaterally ? ? ? ? ?DATA REVIEWED: ? ?Lab Results  ?Component Value Date  ? HGBA1C 7.8 (A) 10/22/2021  ? HGBA1C 7.5 (A) 04/30/2021  ? HGBA1C 8.0 (A) 12/25/2020  ? ?Results for ADLEY, CASTELLO (MRN [id number]) as of 12/25/2020 08:31 ? Ref. Range 11/01/2019 08:17  ?Glutamic Acid Decarb Ab Latest Ref Range: <5 IU/mL <5  ? ? ?Results for SWARA, DONZE (MRN [id number]) as of 12/25/2020 08:31 ? Ref. Range 11/01/2019 08:17  ?ISLET CELL ANTIBODY SCREEN Latest Ref Range: NEGATIVE  NEGATIVE  ? ? ?07/18/2020 ?LDL 120  ?HDL 60 ?Tg 60 ?BUN/Cr 7.7 mg ?BUN/Cr 16/0.810 ? ?ASSESSMENT / PLAN / RECOMMENDATIONS:  ? ?1) Type 2 Diabetes Mellitus, Poorly  controlled, With  complications - Most recent A1c of 7.8  %. Goal A1c < 7.0%.  ? ? ?- A1c  continues to be above goal  ?- She had made drastic dietary and lifestyle changes, we will check C-peptide today with concomitant serum glucose ?-She has not started pioglitazone and I have asked her to start this in 2 weeks if hyperglycemia persists ?- We had discussed switching Januvia to Rybelsus , but she is NOT interested in any medications  that would exacerbate weight loss, including SGLT-2 inhibitors.  ?-I am going to stop her glimepiride and start her on glipizide to be taken twice daily ? ? ?MEDICATIONS:  ?-Stop glimepiride ?-Start glipizide 10 mg twice daily ?- Metformin 500 mg , 10 ML BID  ?- Continue Januvia 100 mg daily  ?- Start Pioglitazone 15 mg daily if BG's remain >180 MG/DL after starting glipizide ? ? ?EDUCATION / INSTRUCTIONS: ?BG monitoring instructions: Patient is instructed to check her blood sugars 2 times a day, supper and Breakfast. ?Call [facility] if: BG persistently < 70  ?I reviewed the Rule  of 15 for the treatment of hypoglycemia in detail with the patient. Literature supplied. ? ? ?2)Dyslipidemia: ? ? ?-LDL above goal at 101 mg/DL 4/09814/2023 ?-We will switch simvastatin to atorvastatin ? ?Medication ?Stop simvastatin 20 mg ?Start atorvastatin 20 mg daily ? ?F/U in 4 months  ? ? ?Signed electronically by: ?Abby Raelyn MoraJaralla Paddy Neis, MD ? ?[facility]  ?[facility] ?[address] ?[place], [place] [address] ?Phone: [phone] ?FAX: [phone] ? ? ?CC: ?Alysia PennaHolwerda, Scott, MD ?[address] ?[place] [address] ?Phone: [phone]  ?Fax: [phone] ? ?Return to Endocrinology clinic as below: ?No future appointments. ? ?  ? ? ?

## 2021-10-22 NOTE — Patient Instructions (Signed)
-   STOP Glimepiride 4 mg  ?- START Glipizide 10 mg, 1 tablet before Breakfast and 1 tablet Before Supper  ?- Continue Metformin 500 mg , 10 mL , Twice daily ?- Continue Januvia 100 mg daily  ?- Start Pioglitazone 15 mg daily if your sugars are consistently over 180 mg/dL  ? ? ? ? ?HOW TO TREAT LOW BLOOD SUGARS (Blood sugar LESS THAN 70 MG/DL) ?Please follow the RULE OF 15 for the treatment of hypoglycemia treatment (when your (blood sugars are less than 70 mg/dL)  ? ?STEP 1: Take 15 grams of carbohydrates when your blood sugar is low, which includes:  ?3-4 GLUCOSE TABS  OR ?3-4 OZ OF JUICE OR REGULAR SODA OR ?ONE TUBE OF GLUCOSE GEL   ? ?STEP 2: RECHECK blood sugar in 15 MINUTES ?STEP 3: If your blood sugar is still low at the 15 minute recheck --> then, go back to STEP 1 and treat AGAIN with another 15 grams of carbohydrates. ? ?

## 2021-10-23 LAB — C-PEPTIDE: C-Peptide: 1.46 ng/mL (ref 0.80–3.85)

## 2021-10-25 DIAGNOSIS — L659 Nonscarring hair loss, unspecified: Secondary | ICD-10-CM | POA: Diagnosis not present

## 2021-10-25 DIAGNOSIS — I10 Essential (primary) hypertension: Secondary | ICD-10-CM | POA: Diagnosis not present

## 2021-10-25 DIAGNOSIS — K5900 Constipation, unspecified: Secondary | ICD-10-CM | POA: Diagnosis not present

## 2021-10-25 DIAGNOSIS — E119 Type 2 diabetes mellitus without complications: Secondary | ICD-10-CM | POA: Diagnosis not present

## 2021-11-03 ENCOUNTER — Encounter: Payer: Self-pay | Admitting: Internal Medicine

## 2021-11-03 DIAGNOSIS — F419 Anxiety disorder, unspecified: Secondary | ICD-10-CM | POA: Diagnosis not present

## 2021-11-23 DIAGNOSIS — F411 Generalized anxiety disorder: Secondary | ICD-10-CM | POA: Diagnosis not present

## 2021-12-21 DIAGNOSIS — F411 Generalized anxiety disorder: Secondary | ICD-10-CM | POA: Diagnosis not present

## 2021-12-31 DIAGNOSIS — Z1231 Encounter for screening mammogram for malignant neoplasm of breast: Secondary | ICD-10-CM | POA: Diagnosis not present

## 2021-12-31 DIAGNOSIS — Z6820 Body mass index (BMI) 20.0-20.9, adult: Secondary | ICD-10-CM | POA: Diagnosis not present

## 2021-12-31 DIAGNOSIS — Z01419 Encounter for gynecological examination (general) (routine) without abnormal findings: Secondary | ICD-10-CM | POA: Diagnosis not present

## 2022-01-11 DIAGNOSIS — F411 Generalized anxiety disorder: Secondary | ICD-10-CM | POA: Diagnosis not present

## 2022-01-13 DIAGNOSIS — E119 Type 2 diabetes mellitus without complications: Secondary | ICD-10-CM | POA: Diagnosis not present

## 2022-01-13 DIAGNOSIS — Z961 Presence of intraocular lens: Secondary | ICD-10-CM | POA: Diagnosis not present

## 2022-01-13 LAB — HM DIABETES EYE EXAM

## 2022-02-01 DIAGNOSIS — I10 Essential (primary) hypertension: Secondary | ICD-10-CM | POA: Diagnosis not present

## 2022-02-01 DIAGNOSIS — E559 Vitamin D deficiency, unspecified: Secondary | ICD-10-CM | POA: Diagnosis not present

## 2022-02-01 DIAGNOSIS — E785 Hyperlipidemia, unspecified: Secondary | ICD-10-CM | POA: Diagnosis not present

## 2022-02-01 DIAGNOSIS — R739 Hyperglycemia, unspecified: Secondary | ICD-10-CM | POA: Diagnosis not present

## 2022-02-03 DIAGNOSIS — F411 Generalized anxiety disorder: Secondary | ICD-10-CM | POA: Diagnosis not present

## 2022-02-04 ENCOUNTER — Encounter: Payer: Self-pay | Admitting: Internal Medicine

## 2022-02-08 DIAGNOSIS — E1165 Type 2 diabetes mellitus with hyperglycemia: Secondary | ICD-10-CM | POA: Diagnosis not present

## 2022-02-08 DIAGNOSIS — I10 Essential (primary) hypertension: Secondary | ICD-10-CM | POA: Diagnosis not present

## 2022-02-08 DIAGNOSIS — Z0000 Encounter for general adult medical examination without abnormal findings: Secondary | ICD-10-CM | POA: Diagnosis not present

## 2022-02-08 DIAGNOSIS — Z1339 Encounter for screening examination for other mental health and behavioral disorders: Secondary | ICD-10-CM | POA: Diagnosis not present

## 2022-02-08 DIAGNOSIS — Z1331 Encounter for screening for depression: Secondary | ICD-10-CM | POA: Diagnosis not present

## 2022-02-17 DIAGNOSIS — J019 Acute sinusitis, unspecified: Secondary | ICD-10-CM | POA: Diagnosis not present

## 2022-02-17 DIAGNOSIS — R519 Headache, unspecified: Secondary | ICD-10-CM | POA: Diagnosis not present

## 2022-02-22 DIAGNOSIS — F411 Generalized anxiety disorder: Secondary | ICD-10-CM | POA: Diagnosis not present

## 2022-02-25 ENCOUNTER — Ambulatory Visit: Payer: BC Managed Care – PPO | Admitting: Internal Medicine

## 2022-03-04 ENCOUNTER — Ambulatory Visit: Payer: BC Managed Care – PPO | Admitting: Internal Medicine

## 2022-03-04 ENCOUNTER — Encounter: Payer: Self-pay | Admitting: Internal Medicine

## 2022-03-04 VITALS — BP 140/86 | HR 88 | Ht 66.5 in | Wt 129.0 lb

## 2022-03-04 DIAGNOSIS — E1165 Type 2 diabetes mellitus with hyperglycemia: Secondary | ICD-10-CM

## 2022-03-04 DIAGNOSIS — E785 Hyperlipidemia, unspecified: Secondary | ICD-10-CM

## 2022-03-04 LAB — POCT GLYCOSYLATED HEMOGLOBIN (HGB A1C): Hemoglobin A1C: 7.7 % — AB (ref 4.0–5.6)

## 2022-03-04 MED ORDER — METFORMIN HCL 500 MG PO TABS: 1000.0000 mg | ORAL_TABLET | Freq: Two times a day (BID) | ORAL | 3 refills | Status: AC

## 2022-03-04 MED ORDER — SITAGLIPTIN PHOSPHATE 100 MG PO TABS: 100.0000 mg | ORAL_TABLET | Freq: Every day | ORAL | 3 refills | Status: AC

## 2022-03-04 MED ORDER — GLIPIZIDE 10 MG PO TABS: 10.0000 mg | ORAL_TABLET | Freq: Two times a day (BID) | ORAL | 3 refills | Status: AC

## 2022-03-04 MED ORDER — PIOGLITAZONE HCL 15 MG PO TABS: 15.0000 mg | ORAL_TABLET | Freq: Every day | ORAL | 2 refills | Status: DC

## 2022-03-04 NOTE — Progress Notes (Signed)
Name: Shaneeka Mankins  Age/ Sex: 56 y.o., female   MRN/ DOB: [id number], 24-Aug-1965     PCP: Velna Hatchet, MD   Reason for Endocrinology Evaluation: Type 2 Diabetes Mellitus  Initial Endocrine Consultative Visit: 11/01/2019    PATIENT IDENTIFIER: [name] is a 56 y.o. female with a past medical history of T2DM and dyslipidemia. The patient has followed with Endocrinology clinic since 11/01/2019 for consultative assistance with management of her diabetes.  DIABETIC HISTORY:  Ms. Fujimoto was diagnosed with T2DM in 2010.Has been on oral glycemic agents since diagnosis.  Her hemoglobin A1c has ranged from 6.1% in 2017, peaking at 8.7% in 2021  On her initial visit to our clinic she had an A1c 7.7% . She was on metformin, Januvia and Glimepiride. We discussed switching Januvia to Rybelsus but pt did not want to lose any more weight. We increased Glimepiride, continued metformin and Januvia   Started pioglitazone 12/2020   GAD-65 and iselt cell Ab's levels are negative   Started pioglitazone 09/2021, switch glimepiride to glipizide    SUBJECTIVE:   During the last visit (10/22/2021): A1c 7.8 % We continued  Glimepiride, metformin and Januvia re-started pioglitazone    Today (03/04/2022): Ms. Szucs is here for a  She checks her blood sugars 2 times daily, preprandial to breakfast and supper. The patient has not had hypoglycemic episodes since the last clinic visit.   She did endorse nausea with glipizide but this has improved  She does snack at night and working on this    Weight has been stable   She is not taking Pioglitazone  Denies nausea, vomiting or diarrhea  She would like to switch  liquid metformin to tablets due to taste   HOME ENDOCRINE REGIMEN:  Metformin 500 mg , 10 ML BID  Glipizide 10 mg twice daily Januvia 100 mg daily  Pioglitazone 15 mg daily  Atorvastatin 20 mg daily    Statin: yes ACE-I/ARB: no   METER DOWNLOAD SUMMARY: 02/2022 Overall Mean  FS Glucose = 179 Standard Deviation = 45  BG Ranges: Low = 81 High = 239   Hypoglycemic Events/30 Days: BG < 50 = 0 Episodes of symptomatic severe hypoglycemia = 0     DIABETIC COMPLICATIONS: Microvascular complications:   Denies: retinopathy, neuropathy, CKD Last Eye Exam: Completed 12/2021  Macrovascular complications:   Denies: CAD, CVA, PVD   HISTORY:  Past Medical History:  Past Medical History:  Diagnosis Date   DM2 (diabetes mellitus, type 2) ([place])    Shingles    Vitamin D deficiency    Past Surgical History: No past surgical history on file. Social History:  reports that she has never smoked. She has never used smokeless tobacco. She reports that she does not drink alcohol. No history on file for drug use. Family History: No family history on file.   HOME MEDICATIONS: Allergies as of 03/04/2022   No Known Allergies      Medication List        Accurate as of March 04, 2022  8:02 AM. If you have any questions, ask your nurse or doctor.          atorvastatin 20 MG tablet Commonly known as: LIPITOR Take 1 tablet (20 mg total) by mouth daily.   COD LIVER OIL PO Take by mouth. daily   DRISDOL PO Take 5,000 Units by mouth daily.   glipiZIDE 10 MG tablet Commonly known as: GLUCOTROL Take 1 tablet (10 mg total) by mouth 2 (two)  times daily before a meal.   hydrochlorothiazide 12.5 MG capsule Commonly known as: MICROZIDE Take 1 capsule (12.5 mg total) by mouth daily.   Metformin HCl 500 MG/5ML Soln TAKE 10 ML(1000 MG) BY MOUTH IN THE MORNING AND AT BEDTIME   metoprolol succinate 25 MG 24 hr tablet Commonly known as: Toprol XL Take 1 tablet (25 mg total) by mouth daily.   norethindrone-ethinyl estradiol 1-5 MG-MCG Tabs tablet Commonly known as: FEMHRT 1/5 norethindrone acetate 1 mg-ethinyl estradiol 5 mcg tablet  TK 1 T PO QD   pioglitazone 15 MG tablet Commonly known as: Actos Take 1 tablet (15 mg total) by mouth daily.    sitaGLIPtin 100 MG tablet Commonly known as: Januvia Take 1 tablet (100 mg total) by mouth daily.   UNABLE TO FIND Med Name: livago meter   valACYclovir 500 MG tablet Commonly known as: VALTREX valacyclovir 500 mg tablet  Take 1 tablet every day by oral route.         OBJECTIVE:   Vital Signs: BP (!) 140/86 (BP Location: Left Arm, Patient Position: Sitting, Cuff Size: Small)   Pulse 88   Ht 5' 6.5" (1.689 m)   Wt 129 lb (58.5 kg)   SpO2 98%   BMI 20.51 kg/m   Wt Readings from Last 3 Encounters:  03/04/22 129 lb (58.5 kg)  10/22/21 131 lb (59.4 kg)  04/30/21 125 lb 12.8 oz (57.1 kg)     Exam: General: Pt appears well and is in NAD  Lungs: Clear with good BS bilat with no rales, rhonchi, or wheezes  Heart: RRR   Abdomen: Normoactive bowel sounds, soft, nontender, without masses or organomegaly palpable  Extremities: No pretibial edema. No tremor.   Neuro: MS is good with appropriate affect, pt is alert and Ox3   DM Foot Exam 04/30/2021  The skin of the feet is intact without sores or ulcerations. The pedal pulses are 2+ on right and 2+ on left. The sensation is intact to a screening 5.07, 10 gram monofilament bilaterally     DATA REVIEWED:  Lab Results  Component Value Date   HGBA1C 7.7 (A) 03/04/2022   HGBA1C 7.8 (A) 10/22/2021   HGBA1C 7.5 (A) 04/30/2021   Results for MIKAILAH, MOREL (MRN [id number]) as of 12/25/2020 08:31  Ref. Range 11/01/2019 08:17  Glutamic Acid Decarb Ab Latest Ref Range: <5 IU/mL <5    Results for CAMMI, CONSALVO (MRN [id number]) as of 12/25/2020 08:31  Ref. Range 11/01/2019 08:17  ISLET CELL ANTIBODY SCREEN Latest Ref Range: NEGATIVE  NEGATIVE    07/18/2020 LDL 120  HDL 60 Tg 60 BUN/Cr 7.7 mg BUN/Cr 16/0.810  ASSESSMENT / PLAN / RECOMMENDATIONS:   1) Type 2 Diabetes Mellitus, Poorly  controlled, With  complications - Most recent A1c of 7.7  %. Goal A1c < 7.0%.    - A1c  continues to be above goal  -In reviewing her  glucose download the patient has been noted with glycemic excursions ranging from 81 to >200 MG/DL.  It appears that the patient either has a high late-night meal or that she snacks, the patient does state that she is working on avoiding snacks at night but this is why her BG's are over 200 mg/DL and in the mornings -She has not started pioglitazone, she read about edema, I did explain the patient that this is a side effect and she could or could not have it and the only way to know is to start the medicine, edema is  not a reversible and if this occurs she can stop the Actos and the edema would resolve, but at this time the the benefit outweighs the risk of keeping her glucose above control for long peers of time -Unfortunately we are limited with glycemic agents as she declines any weight loss medications so this excludes all GLP-1 agonist and SGLT2 inhibitors, as I have recommended switching Januvia to Rybelsus in the past -I did explain to her that we may have to back off on the glipizide if she starts the pioglitazone as it is an insulin sensitizer and she will be at risk for hypoglycemia, she just needs to let us know so we can make the adjustments -She would like to switch metformin from liquid to tablet forms, I am going to avoid the XR formulation as she would not be able to crush dose  MEDICATIONS:   -Continue glipizide 10 mg twice daily -Check metformin to 500 mg tablets, 2 tabs twice daily - Continue Januvia 100 mg daily  - Start Pioglitazone 15 mg daily   EDUCATION / INSTRUCTIONS: BG monitoring instructions: Patient is instructed to check her blood sugars 2 times a day, supper and Breakfast. Call [place] [facility] if: BG persistently < 70  I reviewed the Rule of 15 for the treatment of hypoglycemia in detail with the patient. Literature supplied.   2)Dyslipidemia:   -LDL above goal at 101 mg/DL 01/7563, we switched simvastatin to atorvastatin -Does not report any  intolerance  Medication  Continue atorvastatin 20 mg daily  F/U in 4 months    Signed electronically by: Lyndle Herrlich, MD  [facility] Endocrinology  [facility] [address] [place] [place], [place] [address] Phone: [phone] FAX: [phone]   CC: Alysia Penna, MD [address] [place] [address] Phone: [phone]  Fax: [phone]  Return to Endocrinology clinic as below: No future appointments.

## 2022-03-04 NOTE — Patient Instructions (Signed)
-   Switch liquid metformin to 500 mg tablets, take 2 tablets with breakfast and 2 tablets with supper  - Continue  Glipizide 10 mg, 1 tablet before Breakfast and 1 tablet Before Supper  - Continue Januvia 100 mg daily  - Start Pioglitazone 15 mg daily if your sugars are consistently over 180 mg/dL      HOW TO TREAT LOW BLOOD SUGARS (Blood sugar LESS THAN 70 MG/DL) Please follow the RULE OF 15 for the treatment of hypoglycemia treatment (when your (blood sugars are less than 70 mg/dL)   STEP 1: Take 15 grams of carbohydrates when your blood sugar is low, which includes:  3-4 GLUCOSE TABS  OR 3-4 OZ OF JUICE OR REGULAR SODA OR ONE TUBE OF GLUCOSE GEL    STEP 2: RECHECK blood sugar in 15 MINUTES STEP 3: If your blood sugar is still low at the 15 minute recheck --> then, go back to STEP 1 and treat AGAIN with another 15 grams of carbohydrates.

## 2022-03-22 DIAGNOSIS — F411 Generalized anxiety disorder: Secondary | ICD-10-CM | POA: Diagnosis not present

## 2022-04-12 DIAGNOSIS — L659 Nonscarring hair loss, unspecified: Secondary | ICD-10-CM | POA: Diagnosis not present

## 2022-04-12 DIAGNOSIS — R790 Abnormal level of blood mineral: Secondary | ICD-10-CM | POA: Diagnosis not present

## 2022-04-12 DIAGNOSIS — E119 Type 2 diabetes mellitus without complications: Secondary | ICD-10-CM | POA: Diagnosis not present

## 2022-04-12 DIAGNOSIS — I10 Essential (primary) hypertension: Secondary | ICD-10-CM | POA: Diagnosis not present

## 2022-04-12 DIAGNOSIS — E559 Vitamin D deficiency, unspecified: Secondary | ICD-10-CM | POA: Diagnosis not present

## 2022-04-12 DIAGNOSIS — K5900 Constipation, unspecified: Secondary | ICD-10-CM | POA: Diagnosis not present

## 2022-04-19 DIAGNOSIS — F411 Generalized anxiety disorder: Secondary | ICD-10-CM | POA: Diagnosis not present

## 2022-04-27 DIAGNOSIS — E119 Type 2 diabetes mellitus without complications: Secondary | ICD-10-CM | POA: Diagnosis not present

## 2022-04-27 DIAGNOSIS — R5383 Other fatigue: Secondary | ICD-10-CM | POA: Diagnosis not present

## 2022-04-27 DIAGNOSIS — K581 Irritable bowel syndrome with constipation: Secondary | ICD-10-CM | POA: Diagnosis not present

## 2022-04-27 DIAGNOSIS — N951 Menopausal and female climacteric states: Secondary | ICD-10-CM | POA: Diagnosis not present

## 2022-04-28 ENCOUNTER — Encounter: Payer: Self-pay | Admitting: Internal Medicine

## 2022-04-28 ENCOUNTER — Ambulatory Visit: Payer: BC Managed Care – PPO | Attending: Internal Medicine | Admitting: Internal Medicine

## 2022-04-28 VITALS — BP 157/91 | HR 97 | Ht 66.0 in | Wt 135.6 lb

## 2022-04-28 DIAGNOSIS — I10 Essential (primary) hypertension: Secondary | ICD-10-CM

## 2022-04-28 MED ORDER — HYDROCHLOROTHIAZIDE 25 MG PO TABS: 25.0000 mg | ORAL_TABLET | Freq: Every day | ORAL | 3 refills | Status: AC

## 2022-04-28 NOTE — Progress Notes (Signed)
Cardiology Office Note:    Date:  04/28/2022   ID:  Rachel Dillon, DOB 06/08/1966, MRN [id number]  PCP:  Rachel Hatchet, MD   Rachel Dillon Cardiologist:  Rachel Mayo, MD     Referring MD: Rachel Hatchet, MD   No chief complaint on file. Follow up  History of Present Illness:    Rachel Dillon is a 56 y.o. female with a hx of DM2, HTN seen by Dr. Tamala Julian, saw 01/2021. She was on HCTz 12.5 mg daily and metop 25 mg daily. She wanted to see what her numbers are without medications first.  Currently not taking. BP today 157/91 mmHg.  Blood pressure typically controlled < 130 SBP. Mid 52D systolic.  LDL 95 mmHg . No chest pain or pressure. No SOB.    Cardiology Studies: TTE 03/15/2021- EF 60-65%, normal RV fxn, normal valve fxn, no pulmonary htn ETT 02/16/2021- 217/107 mmHg, METS 8.5, no ST changes.  Past Medical History:  Diagnosis Date   DM2 (diabetes mellitus, type 2) ([place])    Shingles    Vitamin D deficiency     No past surgical history on file.  Current Medications: Current Meds  Medication Sig   atorvastatin (LIPITOR) 20 MG tablet Take 1 tablet (20 mg total) by mouth daily.   COD LIVER OIL PO Take by mouth. daily   Ergocalciferol (DRISDOL PO) Take 5,000 Units by mouth daily.   glipiZIDE (GLUCOTROL) 10 MG tablet Take 1 tablet (10 mg total) by mouth 2 (two) times daily before a meal.   hydrochlorothiazide (HYDRODIURIL) 25 MG tablet Take 1 tablet (25 mg total) by mouth daily.   metFORMIN (GLUCOPHAGE) 500 MG tablet Take 2 tablets (1,000 mg total) by mouth 2 (two) times daily with a meal.   norethindrone-ethinyl estradiol (FEMHRT 1/5) 1-5 MG-MCG TABS tablet norethindrone acetate 1 mg-ethinyl estradiol 5 mcg tablet  TK 1 T PO QD   sitaGLIPtin (JANUVIA) 100 MG tablet Take 1 tablet (100 mg total) by mouth daily.   UNABLE TO FIND Med Name: livago meter   valACYclovir (VALTREX) 500 MG tablet valacyclovir 500 mg tablet  Take 1 tablet every day by oral route.      Allergies:   Patient has no known allergies.   Social History   Socioeconomic History   Marital status: Single    Spouse name: Not on file   Number of children: Not on file   Years of education: Not on file   Highest education level: Not on file  Occupational History   Not on file  Tobacco Use   Smoking status: Never   Smokeless tobacco: Never  Substance and Sexual Activity   Alcohol use: Never   Drug use: Not on file   Sexual activity: Not on file  Other Topics Concern   Not on file  Social History Narrative   Not on file   Social Determinants of Health   Financial Resource Strain: Not on file  Food Insecurity: Not on file  Transportation Needs: Not on file  Physical Activity: Not on file  Stress: Not on file  Social Connections: Not on file     Family History: Mother- DM2. No Heart Disease  ROS:   Please see the history of present illness.      All other systems reviewed and are negative.  EKGs/Labs/Other Studies Reviewed:    The following studies were reviewed today:    EKG:  EKG is  ordered today.  The ekg ordered today demonstrates  04/28/2022- NSR   Recent Labs: 10/22/2021: BUN 15; Creatinine, Ser 0.80; Potassium 4.3; Sodium 138   Recent Lipid Panel No results found for: "CHOL", "TRIG", "HDL", "CHOLHDL", "VLDL", "LDLCALC", "LDLDIRECT"   Risk Assessment/Calculations:     Physical Exam:    VS:  BP (!) 157/91 (BP Location: Left Arm, Patient Position: Sitting, Cuff Size: Normal)   Pulse 97   Ht 5\' 6"  (1.676 m)   Wt 135 lb 9.6 oz (61.5 kg)   SpO2 97%   BMI 21.89 kg/m     Wt Readings from Last 3 Encounters:  04/28/22 135 lb 9.6 oz (61.5 kg)  03/04/22 129 lb (58.5 kg)  10/22/21 131 lb (59.4 kg)     GEN:  Well nourished, well developed in no acute distress HEENT: Normal NECK: No JVD; No carotid bruits LYMPHATICS: No lymphadenopathy CARDIAC: RRR, no murmurs, rubs, gallops RESPIRATORY:  Clear to auscultation without rales,  wheezing or rhonchi  ABDOMEN: Soft, non-tender, non-distended MUSCULOSKELETAL:  No edema; No deformity  SKIN: Warm and dry NEUROLOGIC:  Alert and oriented x 3 PSYCHIATRIC:  Normal affect   ASSESSMENT:    HTN: Bp today high, typically SBP are normal; diastolic mildly eleavated. She was on olmesartan and telmisartan but developed intolerance w/ weakness. She had hypertensive response on ETT up to 217/107. Apprehensive about medications. Start HCTz 25 mg daily.  HLD: continue lipitor 20 mg daily; LDL goal < 100, 95 mg/dL; not high risk  Dm2: 03-05-1994 7.7. Goal A1c <7.  will consider farxiga 10 mg daily PLAN:    In order of problems listed above:  HCTZ 25 mg daily Follow up 6 months      Medication Adjustments/Labs and Tests Ordered: Current medicines are reviewed at length with the patient today.  Concerns regarding medicines are outlined above.  Orders Placed This Encounter  Procedures   EKG 12-Lead   Meds ordered this encounter  Medications   hydrochlorothiazide (HYDRODIURIL) 25 MG tablet    Sig: Take 1 tablet (25 mg total) by mouth daily.    Dispense:  90 tablet    Refill:  3    Patient Instructions  Medication Instructions:  Your physician has recommended you make the following change in your medication: START: Hydrochlorothiazide 25mg  daily.  *If you need a refill on your cardiac medications before your next appointment, please call your pharmacy*   Lab Work: NONE If you have labs (blood work) drawn today and your tests are completely normal, you will receive your results only by: MyChart Message (if you have MyChart) OR A paper copy in the mail If you have any lab test that is abnormal or we need to change your treatment, we will call you to review the results.   Testing/Procedures: NONE   Follow-Up: At [facility], you and your health needs are our priority.  As part of our continuing mission to provide you with exceptional heart care, we have created  designated Provider Care Teams.  These Care Teams include your primary Cardiologist (physician) and Advanced Practice [facility] (APPs -  Physician Assistants and Nurse Practitioners) who all work together to provide you with the care you need, when you need it.  We recommend signing up for the patient portal called "MyChart".  Sign up information is provided on this After Visit Summary.  MyChart is used to connect with patients for Virtual Visits (Telemedicine).  Patients are able to view lab/test results, encounter notes, upcoming appointments, etc.  Non-urgent messages can be sent to your  provider as well.   To learn more about what you can do with MyChart, go to [email or web address].    Your next appointment:   6 month(s)  The format for your next appointment:   In Person  Provider:   Maisie Fus, MD      Signed, Maisie Fus, MD  04/28/2022 4:58 PM    [facility]

## 2022-04-28 NOTE — Patient Instructions (Signed)
Medication Instructions:  Your physician has recommended you make the following change in your medication: START: Hydrochlorothiazide 25mg  daily.  *If you need a refill on your cardiac medications before your next appointment, please call your pharmacy*   Lab Work: NONE If you have labs (blood work) drawn today and your tests are completely normal, you will receive your results only by: [place] (if you have MyChart) OR A paper copy in the mail If you have any lab test that is abnormal or we need to change your treatment, we will call you to review the results.   Testing/Procedures: NONE   Follow-Up: At [facility], you and your health needs are our priority.  As part of our continuing mission to provide you with exceptional heart care, we have created designated Provider Care Teams.  These Care Teams include your primary Cardiologist (physician) and Advanced Practice Providers (APPs -  Physician Assistants and Nurse Practitioners) who all work together to provide you with the care you need, when you need it.  We recommend signing up for the patient portal called "MyChart".  Sign up information is provided on this After Visit Summary.  MyChart is used to connect with patients for Virtual Visits (Telemedicine).  Patients are able to view lab/test results, encounter notes, upcoming appointments, etc.  Non-urgent messages can be sent to your provider as well.   To learn more about what you can do with MyChart, go to [email or web address].    Your next appointment:   6 month(s)  The format for your next appointment:   In Person  Provider:   Janina Mayo, MD

## 2022-05-10 DIAGNOSIS — F411 Generalized anxiety disorder: Secondary | ICD-10-CM | POA: Diagnosis not present

## 2022-06-02 DIAGNOSIS — F411 Generalized anxiety disorder: Secondary | ICD-10-CM | POA: Diagnosis not present

## 2022-07-07 DIAGNOSIS — F411 Generalized anxiety disorder: Secondary | ICD-10-CM | POA: Diagnosis not present

## 2022-07-26 DIAGNOSIS — F411 Generalized anxiety disorder: Secondary | ICD-10-CM | POA: Diagnosis not present

## 2022-08-18 DIAGNOSIS — F411 Generalized anxiety disorder: Secondary | ICD-10-CM | POA: Diagnosis not present

## 2022-08-24 ENCOUNTER — Encounter: Payer: Self-pay | Admitting: Internal Medicine

## 2022-08-26 ENCOUNTER — Telehealth: Payer: Self-pay

## 2022-08-26 NOTE — Telephone Encounter (Signed)
PA submitted and has been APPROVED!

## 2022-08-26 NOTE — Telephone Encounter (Signed)
PA request received via provider for Januvia '100MG'$  tablets  PA submitted via CMM to [employer] and has been APPROVED from 08/26/2022-08/26/2023.  Key: [id number]

## 2022-09-06 DIAGNOSIS — F411 Generalized anxiety disorder: Secondary | ICD-10-CM | POA: Diagnosis not present

## 2022-09-07 DIAGNOSIS — K581 Irritable bowel syndrome with constipation: Secondary | ICD-10-CM | POA: Diagnosis not present

## 2022-09-07 DIAGNOSIS — R790 Abnormal level of blood mineral: Secondary | ICD-10-CM | POA: Diagnosis not present

## 2022-09-07 DIAGNOSIS — R7989 Other specified abnormal findings of blood chemistry: Secondary | ICD-10-CM | POA: Diagnosis not present

## 2022-09-07 DIAGNOSIS — H16223 Keratoconjunctivitis sicca, not specified as Sjogren's, bilateral: Secondary | ICD-10-CM | POA: Diagnosis not present

## 2022-09-07 DIAGNOSIS — N951 Menopausal and female climacteric states: Secondary | ICD-10-CM | POA: Diagnosis not present

## 2022-09-07 DIAGNOSIS — H02885 Meibomian gland dysfunction left lower eyelid: Secondary | ICD-10-CM | POA: Diagnosis not present

## 2022-09-07 DIAGNOSIS — E119 Type 2 diabetes mellitus without complications: Secondary | ICD-10-CM | POA: Diagnosis not present

## 2022-09-07 DIAGNOSIS — R5383 Other fatigue: Secondary | ICD-10-CM | POA: Diagnosis not present

## 2022-09-07 DIAGNOSIS — H02882 Meibomian gland dysfunction right lower eyelid: Secondary | ICD-10-CM | POA: Diagnosis not present

## 2022-09-09 ENCOUNTER — Ambulatory Visit: Payer: BC Managed Care – PPO | Admitting: Internal Medicine

## 2022-09-21 DIAGNOSIS — E119 Type 2 diabetes mellitus without complications: Secondary | ICD-10-CM | POA: Diagnosis not present

## 2022-09-21 DIAGNOSIS — N951 Menopausal and female climacteric states: Secondary | ICD-10-CM | POA: Diagnosis not present

## 2022-09-21 DIAGNOSIS — R5383 Other fatigue: Secondary | ICD-10-CM | POA: Diagnosis not present

## 2022-09-21 DIAGNOSIS — K581 Irritable bowel syndrome with constipation: Secondary | ICD-10-CM | POA: Diagnosis not present

## 2022-10-04 DIAGNOSIS — F411 Generalized anxiety disorder: Secondary | ICD-10-CM | POA: Diagnosis not present

## 2022-10-25 DIAGNOSIS — F411 Generalized anxiety disorder: Secondary | ICD-10-CM | POA: Diagnosis not present

## 2022-10-31 DIAGNOSIS — H02882 Meibomian gland dysfunction right lower eyelid: Secondary | ICD-10-CM | POA: Diagnosis not present

## 2022-10-31 DIAGNOSIS — H16223 Keratoconjunctivitis sicca, not specified as Sjogren's, bilateral: Secondary | ICD-10-CM | POA: Diagnosis not present

## 2022-10-31 DIAGNOSIS — H02885 Meibomian gland dysfunction left lower eyelid: Secondary | ICD-10-CM | POA: Diagnosis not present

## 2022-11-15 DIAGNOSIS — F411 Generalized anxiety disorder: Secondary | ICD-10-CM | POA: Diagnosis not present

## 2022-12-01 DIAGNOSIS — E538 Deficiency of other specified B group vitamins: Secondary | ICD-10-CM | POA: Diagnosis not present

## 2022-12-01 DIAGNOSIS — K581 Irritable bowel syndrome with constipation: Secondary | ICD-10-CM | POA: Diagnosis not present

## 2022-12-01 DIAGNOSIS — R5383 Other fatigue: Secondary | ICD-10-CM | POA: Diagnosis not present

## 2022-12-01 DIAGNOSIS — R7989 Other specified abnormal findings of blood chemistry: Secondary | ICD-10-CM | POA: Diagnosis not present

## 2022-12-01 DIAGNOSIS — E119 Type 2 diabetes mellitus without complications: Secondary | ICD-10-CM | POA: Diagnosis not present

## 2022-12-01 DIAGNOSIS — N951 Menopausal and female climacteric states: Secondary | ICD-10-CM | POA: Diagnosis not present

## 2022-12-01 DIAGNOSIS — R790 Abnormal level of blood mineral: Secondary | ICD-10-CM | POA: Diagnosis not present

## 2022-12-01 DIAGNOSIS — E559 Vitamin D deficiency, unspecified: Secondary | ICD-10-CM | POA: Diagnosis not present

## 2022-12-01 DIAGNOSIS — E785 Hyperlipidemia, unspecified: Secondary | ICD-10-CM | POA: Diagnosis not present

## 2022-12-06 DIAGNOSIS — F411 Generalized anxiety disorder: Secondary | ICD-10-CM | POA: Diagnosis not present

## 2022-12-14 DIAGNOSIS — R5383 Other fatigue: Secondary | ICD-10-CM | POA: Diagnosis not present

## 2022-12-14 DIAGNOSIS — K581 Irritable bowel syndrome with constipation: Secondary | ICD-10-CM | POA: Diagnosis not present

## 2022-12-14 DIAGNOSIS — E119 Type 2 diabetes mellitus without complications: Secondary | ICD-10-CM | POA: Diagnosis not present

## 2022-12-14 DIAGNOSIS — N951 Menopausal and female climacteric states: Secondary | ICD-10-CM | POA: Diagnosis not present

## 2022-12-27 DIAGNOSIS — F411 Generalized anxiety disorder: Secondary | ICD-10-CM | POA: Diagnosis not present

## 2023-01-10 DIAGNOSIS — Z01419 Encounter for gynecological examination (general) (routine) without abnormal findings: Secondary | ICD-10-CM | POA: Diagnosis not present

## 2023-01-10 DIAGNOSIS — Z1231 Encounter for screening mammogram for malignant neoplasm of breast: Secondary | ICD-10-CM | POA: Diagnosis not present

## 2023-01-10 DIAGNOSIS — Z681 Body mass index (BMI) 19 or less, adult: Secondary | ICD-10-CM | POA: Diagnosis not present

## 2023-01-10 DIAGNOSIS — Z124 Encounter for screening for malignant neoplasm of cervix: Secondary | ICD-10-CM | POA: Diagnosis not present

## 2023-01-17 DIAGNOSIS — F411 Generalized anxiety disorder: Secondary | ICD-10-CM | POA: Diagnosis not present

## 2023-01-26 DIAGNOSIS — U071 COVID-19: Secondary | ICD-10-CM | POA: Diagnosis not present

## 2023-01-26 DIAGNOSIS — R0981 Nasal congestion: Secondary | ICD-10-CM | POA: Diagnosis not present

## 2023-02-06 DIAGNOSIS — I10 Essential (primary) hypertension: Secondary | ICD-10-CM | POA: Diagnosis not present

## 2023-02-06 DIAGNOSIS — E559 Vitamin D deficiency, unspecified: Secondary | ICD-10-CM | POA: Diagnosis not present

## 2023-02-06 DIAGNOSIS — E1165 Type 2 diabetes mellitus with hyperglycemia: Secondary | ICD-10-CM | POA: Diagnosis not present

## 2023-02-16 DIAGNOSIS — H02885 Meibomian gland dysfunction left lower eyelid: Secondary | ICD-10-CM | POA: Diagnosis not present

## 2023-02-16 DIAGNOSIS — H02882 Meibomian gland dysfunction right lower eyelid: Secondary | ICD-10-CM | POA: Diagnosis not present

## 2023-02-16 DIAGNOSIS — H16223 Keratoconjunctivitis sicca, not specified as Sjogren's, bilateral: Secondary | ICD-10-CM | POA: Diagnosis not present

## 2023-02-16 DIAGNOSIS — E119 Type 2 diabetes mellitus without complications: Secondary | ICD-10-CM | POA: Diagnosis not present

## 2023-02-22 DIAGNOSIS — Z1331 Encounter for screening for depression: Secondary | ICD-10-CM | POA: Diagnosis not present

## 2023-02-22 DIAGNOSIS — I10 Essential (primary) hypertension: Secondary | ICD-10-CM | POA: Diagnosis not present

## 2023-02-22 DIAGNOSIS — Z1339 Encounter for screening examination for other mental health and behavioral disorders: Secondary | ICD-10-CM | POA: Diagnosis not present

## 2023-02-22 DIAGNOSIS — R82998 Other abnormal findings in urine: Secondary | ICD-10-CM | POA: Diagnosis not present

## 2023-02-22 DIAGNOSIS — E1165 Type 2 diabetes mellitus with hyperglycemia: Secondary | ICD-10-CM | POA: Diagnosis not present

## 2023-02-22 DIAGNOSIS — Z0000 Encounter for general adult medical examination without abnormal findings: Secondary | ICD-10-CM | POA: Diagnosis not present

## 2023-07-27 DIAGNOSIS — R202 Paresthesia of skin: Secondary | ICD-10-CM | POA: Diagnosis not present

## 2023-07-27 DIAGNOSIS — R200 Anesthesia of skin: Secondary | ICD-10-CM | POA: Diagnosis not present

## 2023-07-27 DIAGNOSIS — E1165 Type 2 diabetes mellitus with hyperglycemia: Secondary | ICD-10-CM | POA: Diagnosis not present

## 2023-07-27 DIAGNOSIS — E559 Vitamin D deficiency, unspecified: Secondary | ICD-10-CM | POA: Diagnosis not present

## 2023-09-11 DIAGNOSIS — E1165 Type 2 diabetes mellitus with hyperglycemia: Secondary | ICD-10-CM | POA: Diagnosis not present

## 2023-09-20 DIAGNOSIS — R634 Abnormal weight loss: Secondary | ICD-10-CM | POA: Diagnosis not present

## 2023-11-10 DIAGNOSIS — E1165 Type 2 diabetes mellitus with hyperglycemia: Secondary | ICD-10-CM | POA: Diagnosis not present

## 2023-12-25 DIAGNOSIS — E1165 Type 2 diabetes mellitus with hyperglycemia: Secondary | ICD-10-CM | POA: Diagnosis not present

## 2023-12-25 DIAGNOSIS — R634 Abnormal weight loss: Secondary | ICD-10-CM | POA: Diagnosis not present

## 2023-12-28 DIAGNOSIS — F411 Generalized anxiety disorder: Secondary | ICD-10-CM | POA: Diagnosis not present

## 2024-01-15 DIAGNOSIS — E1165 Type 2 diabetes mellitus with hyperglycemia: Secondary | ICD-10-CM | POA: Diagnosis not present

## 2024-01-17 DIAGNOSIS — F411 Generalized anxiety disorder: Secondary | ICD-10-CM | POA: Diagnosis not present

## 2024-01-23 DIAGNOSIS — Z1231 Encounter for screening mammogram for malignant neoplasm of breast: Secondary | ICD-10-CM | POA: Diagnosis not present

## 2024-01-23 DIAGNOSIS — Z01419 Encounter for gynecological examination (general) (routine) without abnormal findings: Secondary | ICD-10-CM | POA: Diagnosis not present

## 2024-02-19 DIAGNOSIS — E785 Hyperlipidemia, unspecified: Secondary | ICD-10-CM | POA: Diagnosis not present

## 2024-02-19 DIAGNOSIS — Z0189 Encounter for other specified special examinations: Secondary | ICD-10-CM | POA: Diagnosis not present

## 2024-02-19 DIAGNOSIS — E1165 Type 2 diabetes mellitus with hyperglycemia: Secondary | ICD-10-CM | POA: Diagnosis not present

## 2024-02-19 DIAGNOSIS — E559 Vitamin D deficiency, unspecified: Secondary | ICD-10-CM | POA: Diagnosis not present

## 2024-03-07 DIAGNOSIS — F411 Generalized anxiety disorder: Secondary | ICD-10-CM | POA: Diagnosis not present

## 2024-03-08 DIAGNOSIS — Z1331 Encounter for screening for depression: Secondary | ICD-10-CM | POA: Diagnosis not present

## 2024-03-08 DIAGNOSIS — E1165 Type 2 diabetes mellitus with hyperglycemia: Secondary | ICD-10-CM | POA: Diagnosis not present

## 2024-03-08 DIAGNOSIS — Z0000 Encounter for general adult medical examination without abnormal findings: Secondary | ICD-10-CM | POA: Diagnosis not present

## 2024-03-08 DIAGNOSIS — I10 Essential (primary) hypertension: Secondary | ICD-10-CM | POA: Diagnosis not present

## 2024-03-08 DIAGNOSIS — Z1339 Encounter for screening examination for other mental health and behavioral disorders: Secondary | ICD-10-CM | POA: Diagnosis not present

## 2024-03-28 DIAGNOSIS — F411 Generalized anxiety disorder: Secondary | ICD-10-CM | POA: Diagnosis not present

## 2024-04-18 DIAGNOSIS — F411 Generalized anxiety disorder: Secondary | ICD-10-CM | POA: Diagnosis not present

## 2024-05-28 DIAGNOSIS — F411 Generalized anxiety disorder: Secondary | ICD-10-CM | POA: Diagnosis not present

## 2024-05-29 DIAGNOSIS — Z961 Presence of intraocular lens: Secondary | ICD-10-CM | POA: Diagnosis not present

## 2024-05-29 DIAGNOSIS — E119 Type 2 diabetes mellitus without complications: Secondary | ICD-10-CM | POA: Diagnosis not present

## 2024-05-29 DIAGNOSIS — H02885 Meibomian gland dysfunction left lower eyelid: Secondary | ICD-10-CM | POA: Diagnosis not present

## 2024-05-29 DIAGNOSIS — H02882 Meibomian gland dysfunction right lower eyelid: Secondary | ICD-10-CM | POA: Diagnosis not present

## 2024-06-18 DIAGNOSIS — F411 Generalized anxiety disorder: Secondary | ICD-10-CM | POA: Diagnosis not present
